# Patient Record
Sex: Male | Born: 1975 | Race: White | Hispanic: No | Marital: Married | Smoking: Never smoker
Health system: Southern US, Community
[De-identification: ages and names within clinical notes are randomized; demographics above are authoritative.]

## PROBLEM LIST (undated history)

## (undated) DIAGNOSIS — E079 Disorder of thyroid, unspecified: Secondary | ICD-10-CM

## (undated) DIAGNOSIS — N201 Calculus of ureter: Secondary | ICD-10-CM

## (undated) DIAGNOSIS — Z87442 Personal history of urinary calculi: Secondary | ICD-10-CM

## (undated) DIAGNOSIS — Z902 Acquired absence of lung [part of]: Secondary | ICD-10-CM

## (undated) HISTORY — PX: WISDOM TOOTH EXTRACTION: SHX21

## (undated) HISTORY — PX: LUNG LOBECTOMY: SHX167

---

## 2012-06-08 ENCOUNTER — Encounter (HOSPITAL_BASED_OUTPATIENT_CLINIC_OR_DEPARTMENT_OTHER): Payer: Self-pay | Admitting: *Deleted

## 2012-06-08 ENCOUNTER — Emergency Department (HOSPITAL_BASED_OUTPATIENT_CLINIC_OR_DEPARTMENT_OTHER): Payer: Managed Care, Other (non HMO)

## 2012-06-08 ENCOUNTER — Emergency Department (HOSPITAL_BASED_OUTPATIENT_CLINIC_OR_DEPARTMENT_OTHER)
Admission: EM | Admit: 2012-06-08 | Discharge: 2012-06-08 | Disposition: A | Discharge status: Home / Self Care | Payer: Managed Care, Other (non HMO) | Attending: Emergency Medicine | Admitting: Emergency Medicine

## 2012-06-08 DIAGNOSIS — R110 Nausea: Secondary | ICD-10-CM | POA: Insufficient documentation

## 2012-06-08 DIAGNOSIS — R197 Diarrhea, unspecified: Secondary | ICD-10-CM | POA: Insufficient documentation

## 2012-06-08 DIAGNOSIS — N200 Calculus of kidney: Secondary | ICD-10-CM

## 2012-06-08 LAB — URINE MICROSCOPIC-ADD ON

## 2012-06-08 LAB — URINALYSIS, ROUTINE W REFLEX MICROSCOPIC
Glucose, UA: NEGATIVE mg/dL
Leukocytes, UA: NEGATIVE
Protein, ur: NEGATIVE mg/dL
pH: 5.5 (ref 5.0–8.0)

## 2012-06-08 MED ORDER — HYDROMORPHONE HCL PF 1 MG/ML IJ SOLN
0.5000 mg | Freq: Once | INTRAMUSCULAR | Status: AC
  Administered 2012-06-08: 0.5 mg via INTRAVENOUS
  Filled 2012-06-08: qty 1

## 2012-06-08 MED ORDER — ONDANSETRON HCL 4 MG/2ML IJ SOLN
4.0000 mg | Freq: Once | INTRAMUSCULAR | Status: AC
  Administered 2012-06-08: 4 mg via INTRAVENOUS
  Filled 2012-06-08: qty 2

## 2012-06-08 MED ORDER — OXYCODONE-ACETAMINOPHEN 5-325 MG PO TABS: 1.0000 | ORAL_TABLET | ORAL | Status: DC | PRN

## 2012-06-08 MED ORDER — HYDROMORPHONE HCL PF 2 MG/ML IJ SOLN
0.5000 mg | Freq: Once | INTRAMUSCULAR | Status: AC
  Administered 2012-06-08: 0.5 mg via INTRAVENOUS
  Filled 2012-06-08: qty 1

## 2012-06-08 MED ORDER — ONDANSETRON HCL 4 MG PO TABS: 4.0000 mg | ORAL_TABLET | Freq: Three times a day (TID) | ORAL | Status: DC | PRN

## 2012-06-08 NOTE — ED Notes (Addendum)
Melvenia Beam, PA-C at bedside.

## 2012-06-08 NOTE — ED Notes (Signed)
Pt reports left flank pain since Friday- relieved Saturday- pain returned 1000 today

## 2012-06-08 NOTE — ED Provider Notes (Signed)
History     CSN: [id number]  Arrival date & time 06/08/12  1504   First MD Initiated Contact with Patient 06/08/12 1515      Chief Complaint  Patient presents with  . Flank Pain    (Consider location/radiation/quality/duration/timing/severity/associated sxs/prior treatment) Patient is a 37 y.o. male presenting with flank pain. The history is provided by the patient. No language interpreter was used.  Flank Pain This is a new problem. Associated symptoms include abdominal pain and nausea. Pertinent negatives include no coughing, fever or myalgias. Associated symptoms comments: Left flank and LLQ abdominal pain constant since this morning. He has has "twinges" of pain that were brief a couple of times this week. Nausea since this morning with pain today. No fever. He reports diarrhea as well. He has a history of kidney stones and states this feels similar..    Past Medical History  Diagnosis Date  . Kidney stones     Past Surgical History  Procedure Laterality Date  . Lobectomy    . Wisdom tooth extraction      No family history on file.  History  Substance Use Topics  . Smoking status: Never Smoker   . Smokeless tobacco: Never Used  . Alcohol Use: 1.8 oz/week    3 Cans of beer per week      Review of Systems  Constitutional: Negative for fever.  Respiratory: Negative for cough and shortness of breath.   Gastrointestinal: Positive for nausea, abdominal pain and diarrhea.  Genitourinary: Positive for flank pain. Negative for hematuria, scrotal swelling and testicular pain.  Musculoskeletal: Negative for myalgias.    Allergies  Review of patient's allergies indicates no known allergies.  Home Medications   Current Outpatient Rx  Name  Route  Sig  Dispense  Refill  . ibuprofen (ADVIL,MOTRIN) 200 MG tablet   Oral   Take 800 mg by mouth every 6 (six) hours as needed for pain.           BP 165/95  Pulse 71  Temp(Src) 98.1 F (36.7 C) (Oral)  Resp 20  Ht  5\' 11"  (1.803 m)  Wt 245 lb (111.131 kg)  BMI 34.19 kg/m2  SpO2 96%  Physical Exam  Constitutional: He is oriented to person, place, and time. He appears well-developed and well-nourished. No distress.  HENT:  Head: Normocephalic.  Pulmonary/Chest: Effort normal.  Abdominal: Soft.  LLQ tenderness without guarding or mass.   Musculoskeletal: Normal range of motion.  Neurological: He is alert and oriented to person, place, and time.  Skin: Skin is warm.  Psychiatric: He has a normal mood and affect.    ED Course  Procedures (including critical care time)  Labs Reviewed  URINALYSIS, ROUTINE W REFLEX MICROSCOPIC   Results for orders placed during the hospital encounter of 06/08/12  URINALYSIS, ROUTINE W REFLEX MICROSCOPIC      Result Value Range   Color, Urine YELLOW  YELLOW   APPearance CLEAR  CLEAR   Specific Gravity, Urine 1.029  1.005 - 1.030   pH 5.5  5.0 - 8.0   Glucose, UA NEGATIVE  NEGATIVE mg/dL   Hgb urine dipstick MODERATE (*) NEGATIVE   Bilirubin Urine NEGATIVE  NEGATIVE   Ketones, ur 15 (*) NEGATIVE mg/dL   Protein, ur NEGATIVE  NEGATIVE mg/dL   Urobilinogen, UA 0.2  0.0 - 1.0 mg/dL   Nitrite NEGATIVE  NEGATIVE   Leukocytes, UA NEGATIVE  NEGATIVE  URINE MICROSCOPIC-ADD ON      Result Value Range  Squamous Epithelial / LPF FEW (*) RARE   RBC / HPF 7-10  <3 RBC/hpf   Crystals CA OXALATE CRYSTALS (*) NEGATIVE   Ct Abdomen Pelvis Wo Contrast  06/08/2012   *RADIOLOGY REPORT*  Clinical Data:  Left-sided flank pain.  History renal stones.  CT ABDOMEN AND PELVIS WITHOUT CONTRAST (CT UROGRAM)  Technique: Contiguous axial images of the abdomen and pelvis without oral or intravenous contrast were obtained.  Comparison: None  Findings:  Exam is limited for evaluation of entities other than urinary tract calculi due to lack of oral or intravenous contrast.   Lung bases:  Normal  Abdomen/pelvis:  Borderline hepatic steatosis.  Normal spleen, stomach, pancreas, gallbladder,  biliary tract, adrenal glands, right kidney.  Moderate obstructive signs involve the left kidney and ureter to the level of a mid-left ureteric calculus which measures 6 mm on transverse image 59 and 8 mm on coronal image 51. Phleboliths in the pelvis, but no distal ureteric stone.  No retroperitoneal or retrocrural adenopathy.  Scattered colonic diverticula.  Normal terminal ileum and appendix. Normal small bowel without abdominal ascites.    No pelvic adenopathy.    Normal urinary bladder and prostate.  No significant free fluid.  Bones/Musculoskeletal:  No acute osseous abnormality.  Disc bulges at L4-L5 and L5-S1.  IMPRESSION: Moderate left-sided urinary tract obstruction secondary to an 8 mm mid left ureteric calculus.   Original Report Authenticated By: [name], M.D.   No results found.   No diagnosis found.  1. Kidney stone  MDM  Large kidney stone at mid-ureter on left. Pain is controlled. Refer to urology.        [name], PA-C 06/08/12 1744

## 2012-06-08 NOTE — ED Notes (Signed)
Pt reports unable to void at this time.

## 2012-06-10 ENCOUNTER — Other Ambulatory Visit: Payer: Self-pay | Admitting: Urology

## 2012-06-11 NOTE — ED Provider Notes (Signed)
Medical screening examination/treatment/procedure(s) were performed by non-physician practitioner and as supervising physician I was immediately available for consultation/collaboration.   Shelda Jakes, MD 06/11/12 [phone]

## 2012-06-13 ENCOUNTER — Encounter (HOSPITAL_COMMUNITY): Payer: Self-pay | Admitting: Pharmacy Technician

## 2012-06-13 ENCOUNTER — Encounter (HOSPITAL_COMMUNITY): Payer: Self-pay | Admitting: *Deleted

## 2012-06-16 ENCOUNTER — Ambulatory Visit (HOSPITAL_COMMUNITY): Payer: Managed Care, Other (non HMO)

## 2012-06-16 ENCOUNTER — Encounter (HOSPITAL_COMMUNITY)
Admission: RE | Disposition: A | Discharge status: Home / Self Care | Payer: Self-pay | Source: Ambulatory Visit | Attending: Urology

## 2012-06-16 ENCOUNTER — Ambulatory Visit (HOSPITAL_COMMUNITY)
Admission: RE | Admit: 2012-06-16 | Discharge: 2012-06-16 | Disposition: A | Discharge status: Home / Self Care | Payer: Managed Care, Other (non HMO) | Source: Ambulatory Visit | Attending: Urology | Admitting: Urology

## 2012-06-16 ENCOUNTER — Encounter (HOSPITAL_COMMUNITY): Payer: Self-pay | Admitting: *Deleted

## 2012-06-16 DIAGNOSIS — Z87442 Personal history of urinary calculi: Secondary | ICD-10-CM | POA: Insufficient documentation

## 2012-06-16 DIAGNOSIS — N201 Calculus of ureter: Secondary | ICD-10-CM

## 2012-06-16 DIAGNOSIS — Z902 Acquired absence of lung [part of]: Secondary | ICD-10-CM | POA: Insufficient documentation

## 2012-06-16 SURGERY — LITHOTRIPSY, ESWL
Anesthesia: LOCAL | Laterality: Left

## 2012-06-16 MED ORDER — CIPROFLOXACIN HCL 500 MG PO TABS
500.0000 mg | ORAL_TABLET | ORAL | Status: AC
  Administered 2012-06-16: 500 mg via ORAL
  Filled 2012-06-16: qty 1

## 2012-06-16 MED ORDER — DEXTROSE-NACL 5-0.45 % IV SOLN
INTRAVENOUS | Status: DC
  Administered 2012-06-16: 13:00:00 via INTRAVENOUS

## 2012-06-16 MED ORDER — DIAZEPAM 5 MG PO TABS
10.0000 mg | ORAL_TABLET | ORAL | Status: AC
  Administered 2012-06-16: 10 mg via ORAL
  Filled 2012-06-16: qty 2

## 2012-06-16 MED ORDER — DIPHENHYDRAMINE HCL 25 MG PO CAPS
25.0000 mg | ORAL_CAPSULE | ORAL | Status: AC
  Administered 2012-06-16: 25 mg via ORAL
  Filled 2012-06-16: qty 1

## 2012-06-16 NOTE — Interval H&P Note (Signed)
History and Physical Interval Note:  06/16/2012 1:58 PM  [employer]  has presented today for surgery, with the diagnosis of LEFT URETERAL STONE   The various methods of treatment have been discussed with the patient and family. After consideration of risks, benefits and other options for treatment, the patient has consented to  Procedure(s): LEFT EXTRACORPOREAL SHOCK WAVE LITHOTRIPSY (ESWL) (Left) as a surgical intervention .  The patient's history has been reviewed, patient examined, no change in status, stable for surgery.  Long have reviewed the patient's chart and labs.  Questions were answered to the patient's satisfaction.     Louis Long

## 2012-06-16 NOTE — Progress Notes (Signed)
Lt side redness, post lithotripsy.  D/c instructions given and explained to pt and pt wife.  Prescriptions given to pt's wife.

## 2012-06-16 NOTE — H&P (Signed)
hief Complaint  cc: [facility], [place].   Reason For Visit  Kidney stone   Active Problems Problems  1. Mid Ureteral Stone On The Left 592.1  History of Present Illness      37 yo married male presents today for f/u after being seen in the ER on 06/08/12 for Lt flank pain & LLQ abdominal pain.  CT showed an 8mm mid Lt ureteral stone. Pt passed stone  5 yrs ago. No stone analysis.   Past Medical History Problems  1. History of  Nephrolithiasis V13.01  Surgical History Problems  1. History of  Lung Lobectomy Right V45.76  Current Meds 1. Ibuprofen 800 MG Oral Tablet; Therapy: (Recorded:03Jun2014) to 2. Ondansetron 4 MG Oral Tablet Dispersible; Therapy: (Recorded:03Jun2014) to 3. Oxycodone-Acetaminophen CAPS; Therapy: (Recorded:03Jun2014) to  Allergies Medication  1. No Known Drug Allergies  Family History Problems  1. Family history of  Family Health Status - Father's Age 21. Family history of  Family Health Status - Mother's Age 79. 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  Gastrointestinal: nausea, vomiting, flank pain, abdominal pain, heartburn and diarrhea.  Constitutional: night sweats and feeling tired (fatigue).  Musculoskeletal: back pain.    Vitals Vital Signs [Data Includes: Last 1 Day]  03Jun2014 11:22AM  BMI Calculated: 34.51 BSA Calculated: 2.34 Height: 5 ft 11.7 in Weight: 252 lb  Blood Pressure: 134 / 87 Heart Rate: 84  Physical Exam Constitutional: Well  nourished and well developed . No acute distress.  ENT:. The ears and nose are normal in appearance.  Neck: The appearance of the neck is normal and no neck mass is present.  Pulmonary: No respiratory distress and normal respiratory rhythm and effort.  Cardiovascular: Heart rate and rhythm are normal . No peripheral edema.  Abdomen: The abdomen is soft and nontender. No masses are palpated. Mild tenderness in the LLQ is present. No CVA tenderness. No hernias are palpable. No hepatosplenomegaly noted.  Rectal: Rectal exam demonstrates normal sphincter tone, no tenderness and no masses. The prostate has no nodularity and is not tender. The left seminal vesicle is nonpalpable. The right seminal vesicle is nonpalpable. The perineum is normal on inspection.  Genitourinary: Examination of the penis demonstrates no discharge, no masses, no lesions and a normal meatus. The scrotum is without lesions. The right epididymis is palpably normal and non-tender. The left epididymis is palpably normal and non-tender. The right testis is non-tender and without masses. The left testis is non-tender and without masses.  Lymphatics: The femoral and inguinal nodes are not enlarged or tender.  Skin: Normal skin turgor, no visible rash and no visible skin lesions.  Neuro/Psych:. Mood and affect are appropriate.    Results/Data  Urine [Data Includes: Last 1 Day]   03Jun2014  COLOR YELLOW   APPEARANCE CLEAR   SPECIFIC GRAVITY 1.010   pH 5.5   GLUCOSE NEG mg/dL  BILIRUBIN NEG   KETONE NEG mg/dL  BLOOD NEG   PROTEIN NEG mg/dL  UROBILINOGEN 0.2 mg/dL  NITRITE NEG   LEUKOCYTE ESTERASE NEG    10 Jun 2012 11:11 AM   UA With REFLEX       COLOR YELLOW       APPEARANCE CLEAR       SPECIFIC GRAVITY 1.010       pH 5.5       GLUCOSE NEG       BILIRUBIN NEG       KETONE NEG       BLOOD NEG       PROTEIN NEG       UROBILINOGEN 0.2       NITRITE NEG       LEUKOCYTE ESTERASE NEG    Procedure  KUB: 8mm irregular  stone at same level as CT stone identified from hospital. ( CT reviewed with patient).     Assessment Assessed  1. Mid Ureteral Stone On The Left 592.1   KUB: 8mm LLureteral stone. He will need lithotripsyu, and we have discussed options of L ureteral stent tonight. or wait until next Monday for lithotripsy. he will wait for lithotripsy. He is re-written for pain med and zofran.   Plan  Mid Ureteral Stone On The Left (592.1)  1. Ondansetron 4 MG Oral Tablet Dispersible; TAKE 1 TABLET 4 times daily PRN nausea; Last  Rx:03Jun2014 2. Oxycodone-Acetaminophen 5-325 MG Oral Tablet; TAKE 1 TABLET Every  4 hours PRN pain;  Therapy: 03Jun2014 to (Evaluate:03Jul2014); Last Rx:03Jun2014 3. KUB  Done: 03Jun2014 12:00AM    1. Hold ASA. Hold ibuprofen 2. Percocet 5/325 q 4-6 prn pain/Zofran re-written. 3. lithotripsy add-on Monday night.   UA With REFLEX  Status: Resulted - Requires Verification  Done: 01Jan0001 12:00AM Ordered Today; For: Health Maintenance (V70.0); Ordered By: Jethro Bolus  Due: 05Jun2014 Marked Important; Last Updated By: Blinda Leatherwood   Signatures Electronically signed by : Jethro Bolus, M.D.; Jun 10 2012  1:18PM

## 2012-06-26 HISTORY — PX: EXTRACORPOREAL SHOCK WAVE LITHOTRIPSY: SHX1557

## 2012-08-10 ENCOUNTER — Encounter (HOSPITAL_BASED_OUTPATIENT_CLINIC_OR_DEPARTMENT_OTHER): Payer: Self-pay | Admitting: *Deleted

## 2012-08-10 ENCOUNTER — Emergency Department (HOSPITAL_BASED_OUTPATIENT_CLINIC_OR_DEPARTMENT_OTHER): Payer: Managed Care, Other (non HMO)

## 2012-08-10 ENCOUNTER — Emergency Department (HOSPITAL_BASED_OUTPATIENT_CLINIC_OR_DEPARTMENT_OTHER)
Admission: EM | Admit: 2012-08-10 | Discharge: 2012-08-10 | Disposition: A | Discharge status: Home / Self Care | Payer: Managed Care, Other (non HMO) | Attending: Emergency Medicine | Admitting: Emergency Medicine

## 2012-08-10 DIAGNOSIS — N200 Calculus of kidney: Secondary | ICD-10-CM | POA: Insufficient documentation

## 2012-08-10 DIAGNOSIS — J45909 Unspecified asthma, uncomplicated: Secondary | ICD-10-CM | POA: Insufficient documentation

## 2012-08-10 LAB — URINALYSIS, ROUTINE W REFLEX MICROSCOPIC
Glucose, UA: NEGATIVE mg/dL
Ketones, ur: 15 mg/dL — AB
Leukocytes, UA: NEGATIVE
Protein, ur: 30 mg/dL — AB

## 2012-08-10 LAB — URINE MICROSCOPIC-ADD ON

## 2012-08-10 MED ORDER — ONDANSETRON HCL 4 MG PO TABS: 4.0000 mg | ORAL_TABLET | Freq: Three times a day (TID) | ORAL | Status: DC | PRN

## 2012-08-10 MED ORDER — KETOROLAC TROMETHAMINE 30 MG/ML IJ SOLN
30.0000 mg | Freq: Once | INTRAMUSCULAR | Status: AC
  Administered 2012-08-10: 30 mg via INTRAVENOUS
  Filled 2012-08-10: qty 1

## 2012-08-10 MED ORDER — HYDROMORPHONE HCL 2 MG PO TABS: 2.0000 mg | ORAL_TABLET | ORAL | Status: DC | PRN

## 2012-08-10 MED ORDER — HYDROMORPHONE HCL PF 1 MG/ML IJ SOLN: 1.0000 mg | Freq: Once | INTRAMUSCULAR | Status: AC

## 2012-08-10 MED ORDER — HYDROMORPHONE HCL PF 1 MG/ML IJ SOLN
INTRAMUSCULAR | Status: AC
  Filled 2012-08-10: qty 1

## 2012-08-10 MED ORDER — TAMSULOSIN HCL 0.4 MG PO CAPS: 0.4000 mg | ORAL_CAPSULE | Freq: Every day | ORAL | Status: DC

## 2012-08-10 MED ORDER — HYDROMORPHONE HCL PF 1 MG/ML IJ SOLN
1.0000 mg | Freq: Once | INTRAMUSCULAR | Status: AC
  Administered 2012-08-10: 1 mg via INTRAVENOUS
  Filled 2012-08-10: qty 1

## 2012-08-10 MED ORDER — HYDROMORPHONE HCL PF 1 MG/ML IJ SOLN
INTRAMUSCULAR | Status: AC
  Administered 2012-08-10: 1 mg via INTRAVENOUS
  Filled 2012-08-10: qty 1

## 2012-08-10 MED ORDER — ONDANSETRON HCL 4 MG/2ML IJ SOLN
INTRAMUSCULAR | Status: AC
  Administered 2012-08-10: 4 mg via INTRAVENOUS
  Filled 2012-08-10: qty 2

## 2012-08-10 MED ORDER — HYDROMORPHONE HCL PF 1 MG/ML IJ SOLN
1.0000 mg | Freq: Once | INTRAMUSCULAR | Status: AC
  Administered 2012-08-10: 1 mg via INTRAVENOUS

## 2012-08-10 MED ORDER — ONDANSETRON HCL 4 MG/2ML IJ SOLN: 4.0000 mg | Freq: Once | INTRAMUSCULAR | Status: AC

## 2012-08-10 NOTE — ED Notes (Signed)
Patient states that his pain has decreased, but he still feels lower abd pressure. Patient is going to try and urinate on his own.

## 2012-08-10 NOTE — ED Notes (Signed)
Pt states onset of left flank pain and left side pain that started at 430pm. States he had a lithotripsy done in June for a kidney stone. States he passed one small stone since then. Pt unable to sit still at triage. C/o nausea. States unable to urinate at present.

## 2012-08-10 NOTE — ED Provider Notes (Signed)
CSN: [id number]     Arrival date & time 08/10/12  1918 History     First MD Initiated Contact with Patient 08/10/12 1945     Chief Complaint  Patient presents with  . Flank Pain   (Consider location/radiation/quality/duration/timing/severity/associated sxs/prior Treatment) Patient is a 37 y.o. male presenting with flank pain. The history is provided by the patient. No language interpreter was used.  Flank Pain This is a new problem. The current episode started today. The problem occurs constantly. The problem has been gradually worsening. Associated symptoms include abdominal pain. Nothing aggravates the symptoms. He has tried nothing for the symptoms. The treatment provided moderate relief.   Pt complains of pain in left back.   Pt has had kidney stones in the past.  Pt had lithrotripsy last month Past Medical History  Diagnosis Date  . Kidney stones   . Asthma     as a child  none now  . Kidney stone    Past Surgical History  Procedure Laterality Date  . Lobectomy    . Wisdom tooth extraction     No family history on file. History  Substance Use Topics  . Smoking status: Never Smoker   . Smokeless tobacco: Never Used  . Alcohol Use: No    Review of Systems  Gastrointestinal: Positive for abdominal pain.  Genitourinary: Positive for flank pain.  All other systems reviewed and are negative.    Allergies  Review of patient's allergies indicates no known allergies.  Home Medications   Current Outpatient Rx  Name  Route  Sig  Dispense  Refill  . tamsulosin (FLOMAX) 0.4 MG CAPS   Oral   Take by mouth.         . ondansetron (ZOFRAN) 4 MG tablet   Oral   Take 1 tablet (4 mg total) by mouth every 8 (eight) hours as needed for nausea.   20 tablet   0   . oxyCODONE-acetaminophen (PERCOCET/ROXICET) 5-325 MG per tablet   Oral   Take 1-2 tablets by mouth every 4 (four) hours as needed for pain.   20 tablet   0    BP 148/65  Pulse 80  Temp(Src) 97.8 F (36.6  C) (Oral)  Resp 16  Ht 5' 11.75" (1.822 m)  Wt 145 lb (65.772 kg)  BMI 19.81 kg/m2  SpO2 99% Physical Exam  Constitutional: He is oriented to person, place, and time. He appears well-developed and well-nourished.  HENT:  Head: Normocephalic.  Right Ear: External ear normal.  Mouth/Throat: Oropharynx is clear and moist.  Eyes: Pupils are equal, round, and reactive to light.  Neck: Normal range of motion. Neck supple.  Cardiovascular: Normal rate and normal heart sounds.   Pulmonary/Chest: Effort normal.  Abdominal: Soft.  Musculoskeletal: Normal range of motion.  Neurological: He is alert and oriented to person, place, and time. He has normal reflexes.  Skin: Skin is warm.  Psychiatric: He has a normal mood and affect.    ED Course   Procedures (including critical care time)  Labs Reviewed  URINALYSIS, ROUTINE W REFLEX MICROSCOPIC - Abnormal; Notable for the following:    APPearance CLOUDY (*)    Specific Gravity, Urine 1.036 (*)    Hgb urine dipstick LARGE (*)    Bilirubin Urine SMALL (*)    Ketones, ur 15 (*)    Protein, ur 30 (*)    All other components within normal limits  URINE CULTURE  URINE MICROSCOPIC-ADD ON   Ct Abdomen Pelvis Wo  Contrast  08/10/2012   *RADIOLOGY REPORT*  Clinical Data: Left flank pain.  History of urinary tract stones and prior lithotripsy.  CT ABDOMEN AND PELVIS WITHOUT CONTRAST  Technique:  Multidetector CT imaging of the abdomen and pelvis was performed following the standard protocol without intravenous contrast.  Comparison: CT abdomen and pelvis 06/08/2012.  Findings: The lung bases are clear.  No pleural or pericardial effusion is identified.  The patient has moderate left hydronephrosis.  The previously seen 0.6 cm left ureteral stone has progressed down the left ureter and is now just proximal to the UVJ.  No other urinary tract stones are seen on the right or left.  The liver, gallbladder, adrenal glands, spleen and pancreas appear normal.   The urinary bladder is decompressed but otherwise unremarkable.  A few colonic diverticula are again seen but there is no evidence of diverticulitis.  The stomach, small bowel and appendix appear normal.  No lymphadenopathy or fluid is identified. No focal bony abnormality.  IMPRESSION: Moderate left hydronephrosis persists as on the prior study.  0.6 cm proximal left ureteral stone seen on the prior examination has progressed down the left ureter and is now just proximal to the left UVJ.  No new abnormality is identified.   Original Report Authenticated By: Holley Dexter, M.D.   No diagnosis found.  MDM  Pt counseled on stone at uvj    Pt advised to call his urologist tomorrow to be seen for evaluation.   Pt given rx for flomax, zofran, dilaudid  Elson Areas, [place] 08/10/12 2229

## 2012-08-11 NOTE — ED Provider Notes (Signed)
Medical screening examination/treatment/procedure(s) were performed by non-physician practitioner and as supervising physician I was immediately available for consultation/collaboration.  Aleeha Boline, MD 08/11/12 0005 

## 2012-08-12 LAB — URINE CULTURE: Culture: NO GROWTH

## 2012-08-13 ENCOUNTER — Other Ambulatory Visit: Payer: Self-pay | Admitting: Urology

## 2012-08-14 ENCOUNTER — Encounter (HOSPITAL_BASED_OUTPATIENT_CLINIC_OR_DEPARTMENT_OTHER): Payer: Self-pay | Admitting: *Deleted

## 2012-08-18 ENCOUNTER — Encounter (HOSPITAL_BASED_OUTPATIENT_CLINIC_OR_DEPARTMENT_OTHER): Payer: Self-pay | Admitting: *Deleted

## 2012-08-18 NOTE — Progress Notes (Signed)
NPO AFTER MN. ARRIVES AT 1015. NEEDS HG. MAY TAKE RX PAIN MED (ONE TYPE) AND/ OR NAUSEA IF NEEDED W/ SIPS OF WATER.

## 2012-08-22 ENCOUNTER — Encounter (HOSPITAL_BASED_OUTPATIENT_CLINIC_OR_DEPARTMENT_OTHER)
Admission: RE | Disposition: A | Discharge status: Home / Self Care | Payer: Self-pay | Source: Ambulatory Visit | Attending: Urology

## 2012-08-22 ENCOUNTER — Ambulatory Visit (HOSPITAL_BASED_OUTPATIENT_CLINIC_OR_DEPARTMENT_OTHER): Payer: Managed Care, Other (non HMO) | Admitting: Anesthesiology

## 2012-08-22 ENCOUNTER — Ambulatory Visit (HOSPITAL_COMMUNITY): Payer: Managed Care, Other (non HMO)

## 2012-08-22 ENCOUNTER — Encounter (HOSPITAL_BASED_OUTPATIENT_CLINIC_OR_DEPARTMENT_OTHER): Payer: Self-pay | Admitting: Anesthesiology

## 2012-08-22 ENCOUNTER — Encounter (HOSPITAL_BASED_OUTPATIENT_CLINIC_OR_DEPARTMENT_OTHER): Payer: Self-pay

## 2012-08-22 ENCOUNTER — Ambulatory Visit (HOSPITAL_BASED_OUTPATIENT_CLINIC_OR_DEPARTMENT_OTHER)
Admission: RE | Admit: 2012-08-22 | Discharge: 2012-08-22 | Disposition: A | Discharge status: Home / Self Care | Payer: Managed Care, Other (non HMO) | Source: Ambulatory Visit | Attending: Urology | Admitting: Urology

## 2012-08-22 DIAGNOSIS — N132 Hydronephrosis with renal and ureteral calculous obstruction: Secondary | ICD-10-CM

## 2012-08-22 DIAGNOSIS — Z79899 Other long term (current) drug therapy: Secondary | ICD-10-CM | POA: Insufficient documentation

## 2012-08-22 DIAGNOSIS — N201 Calculus of ureter: Secondary | ICD-10-CM | POA: Insufficient documentation

## 2012-08-22 HISTORY — DX: Acquired absence of lung (part of): Z90.2

## 2012-08-22 HISTORY — PX: CYSTOSCOPY WITH RETROGRADE PYELOGRAM, URETEROSCOPY AND STENT PLACEMENT: SHX5789

## 2012-08-22 HISTORY — DX: Calculus of ureter: N20.1

## 2012-08-22 HISTORY — PX: HOLMIUM LASER APPLICATION: SHX5852

## 2012-08-22 HISTORY — DX: Personal history of urinary calculi: Z87.442

## 2012-08-22 LAB — POCT HEMOGLOBIN-HEMACUE: Hemoglobin: 16.2 g/dL (ref 13.0–17.0)

## 2012-08-22 SURGERY — CYSTOURETEROSCOPY, WITH RETROGRADE PYELOGRAM AND STENT INSERTION
Anesthesia: General | Site: Ureter | Laterality: Left

## 2012-08-22 MED ORDER — PROPOFOL 10 MG/ML IV BOLUS
INTRAVENOUS | Status: DC | PRN
  Administered 2012-08-22: 300 mg via INTRAVENOUS

## 2012-08-22 MED ORDER — OXYBUTYNIN CHLORIDE ER 5 MG PO TB24
5.0000 mg | ORAL_TABLET | Freq: Every day | ORAL | Status: DC
Start: 2012-08-22 — End: 2020-09-30

## 2012-08-22 MED ORDER — LIDOCAINE HCL (CARDIAC) 20 MG/ML IV SOLN
INTRAVENOUS | Status: DC | PRN
  Administered 2012-08-22: 60 mg via INTRAVENOUS

## 2012-08-22 MED ORDER — FENTANYL CITRATE 0.05 MG/ML IJ SOLN
25.0000 ug | INTRAMUSCULAR | Status: DC | PRN
  Filled 2012-08-22: qty 1

## 2012-08-22 MED ORDER — BELLADONNA ALKALOIDS-OPIUM 16.2-60 MG RE SUPP
RECTAL | Status: DC | PRN
  Administered 2012-08-22: 1 via RECTAL

## 2012-08-22 MED ORDER — IOHEXOL 350 MG/ML SOLN
INTRAVENOUS | Status: DC | PRN
  Administered 2012-08-22: 4 mL via INTRAVENOUS

## 2012-08-22 MED ORDER — ONDANSETRON HCL 4 MG/2ML IJ SOLN
INTRAMUSCULAR | Status: DC | PRN
  Administered 2012-08-22: 4 mg via INTRAVENOUS

## 2012-08-22 MED ORDER — LIDOCAINE HCL 2 % EX GEL
CUTANEOUS | Status: DC | PRN
  Administered 2012-08-22: 1

## 2012-08-22 MED ORDER — ACETAMINOPHEN 10 MG/ML IV SOLN
INTRAVENOUS | Status: DC | PRN
  Administered 2012-08-22: 1000 mg via INTRAVENOUS

## 2012-08-22 MED ORDER — FENTANYL CITRATE 0.05 MG/ML IJ SOLN
INTRAMUSCULAR | Status: DC | PRN
  Administered 2012-08-22 (×2): 25 ug via INTRAVENOUS
  Administered 2012-08-22: 50 ug via INTRAVENOUS

## 2012-08-22 MED ORDER — URELLE 81 MG PO TABS: 1.0000 | ORAL_TABLET | Freq: Three times a day (TID) | ORAL | Status: DC

## 2012-08-22 MED ORDER — GLYCOPYRROLATE 0.2 MG/ML IJ SOLN
INTRAMUSCULAR | Status: DC | PRN
  Administered 2012-08-22: 0.2 mg via INTRAVENOUS

## 2012-08-22 MED ORDER — CEFAZOLIN SODIUM-DEXTROSE 2-3 GM-% IV SOLR
2.0000 g | INTRAVENOUS | Status: AC
  Administered 2012-08-22: 2 g via INTRAVENOUS
  Filled 2012-08-22: qty 50

## 2012-08-22 MED ORDER — KETOROLAC TROMETHAMINE 30 MG/ML IJ SOLN
INTRAMUSCULAR | Status: DC | PRN
  Administered 2012-08-22: 30 mg via INTRAVENOUS

## 2012-08-22 MED ORDER — SODIUM CHLORIDE 0.9 % IR SOLN
Status: DC | PRN
  Administered 2012-08-22: 6000 mL

## 2012-08-22 MED ORDER — LACTATED RINGERS IV SOLN
INTRAVENOUS | Status: DC
  Administered 2012-08-22: 11:00:00 via INTRAVENOUS
  Filled 2012-08-22: qty 1000

## 2012-08-22 MED ORDER — PROMETHAZINE HCL 25 MG/ML IJ SOLN
6.2500 mg | INTRAMUSCULAR | Status: DC | PRN
  Filled 2012-08-22: qty 1

## 2012-08-22 MED ORDER — DEXAMETHASONE SODIUM PHOSPHATE 4 MG/ML IJ SOLN
INTRAMUSCULAR | Status: DC | PRN
  Administered 2012-08-22: 10 mg via INTRAVENOUS

## 2012-08-22 MED ORDER — MIDAZOLAM HCL 5 MG/5ML IJ SOLN
INTRAMUSCULAR | Status: DC | PRN
  Administered 2012-08-22: 2 mg via INTRAVENOUS

## 2012-08-22 MED ORDER — KETOROLAC TROMETHAMINE 30 MG/ML IJ SOLN
15.0000 mg | Freq: Once | INTRAMUSCULAR | Status: DC | PRN
  Filled 2012-08-22: qty 1

## 2012-08-22 MED ORDER — OXYCODONE-ACETAMINOPHEN 5-325 MG PO TABS: 1.0000 | ORAL_TABLET | ORAL | Status: DC | PRN

## 2012-08-22 MED ORDER — TRIMETHOPRIM 100 MG PO TABS: 100.0000 mg | ORAL_TABLET | ORAL | Status: DC

## 2012-08-22 SURGICAL SUPPLY — 34 items
ADAPTER CATH URET PLST 4-6FR (CATHETERS) ×3 IMPLANT
BAG DRAIN URO-CYSTO SKYTR STRL (DRAIN) ×3 IMPLANT
BASKET LASER NITINOL 1.9FR (BASKET) IMPLANT
BASKET STNLS GEMINI 4WIRE 3FR (BASKET) IMPLANT
BASKET ZERO TIP NITINOL 2.4FR (BASKET) ×3 IMPLANT
BOOTIES KNEE HIGH SLOAN (MISCELLANEOUS) ×3 IMPLANT
BRUSH URET BIOPSY 3F (UROLOGICAL SUPPLIES) IMPLANT
CANISTER SUCT LVC 12 LTR MEDI- (MISCELLANEOUS) ×3 IMPLANT
CATH CLEAR GEL 3F BACKSTOP (CATHETERS) ×3 IMPLANT
CATH INTERMIT  6FR 70CM (CATHETERS) ×3 IMPLANT
CATH URET 5FR 28IN CONE TIP (BALLOONS)
CATH URET 5FR 28IN OPEN ENDED (CATHETERS) IMPLANT
CATH URET 5FR 70CM CONE TIP (BALLOONS) IMPLANT
CATH URET DUAL LUMEN 6-10FR 50 (CATHETERS) IMPLANT
CLOTH BEACON ORANGE TIMEOUT ST (SAFETY) ×3 IMPLANT
DRAPE CAMERA CLOSED 9X96 (DRAPES) ×3 IMPLANT
ELECT REM PT RETURN 9FT ADLT (ELECTROSURGICAL)
ELECTRODE REM PT RTRN 9FT ADLT (ELECTROSURGICAL) IMPLANT
GLOVE BIO SURGEON STRL SZ7 (GLOVE) ×3 IMPLANT
GLOVE BIOGEL M 6.5 STRL (GLOVE) ×3 IMPLANT
GOWN PREVENTION PLUS LG XLONG (DISPOSABLE) ×3 IMPLANT
GOWN STRL REIN XL XLG (GOWN DISPOSABLE) ×3 IMPLANT
GUIDEWIRE 0.038 PTFE COATED (WIRE) IMPLANT
GUIDEWIRE ANG ZIPWIRE 038X150 (WIRE) IMPLANT
GUIDEWIRE STR DUAL SENSOR (WIRE) ×3 IMPLANT
IV NS IRRIG 3000ML ARTHROMATIC (IV SOLUTION) ×6 IMPLANT
KIT BALLIN UROMAX 15FX10 (LABEL) IMPLANT
KIT BALLN UROMAX 15FX4 (MISCELLANEOUS) IMPLANT
KIT BALLN UROMAX 26 75X4 (MISCELLANEOUS)
LASER FIBER DISP (UROLOGICAL SUPPLIES) ×3 IMPLANT
SET HIGH PRES BAL DIL (LABEL)
SHEATH ACCESS URETERAL 38CM (SHEATH) IMPLANT
SHEATH ACCESS URETERAL 54CM (SHEATH) IMPLANT
STENT URET 6FRX26 CONTOUR (STENTS) ×3 IMPLANT

## 2012-08-22 NOTE — Interval H&P Note (Signed)
History and Physical Interval Note:  08/22/2012 10:15 AM  Louis Long  has presented today for surgery, with the diagnosis of left ureteral stone  The various methods of treatment have been discussed with the patient and family. After consideration of risks, benefits and other options for treatment, the patient has consented to  Procedure(s): CYSTOSCOPY WITH RETROGRADE PYELOGRAM, LEFT URETEROSCOPY LASER AND LEFT DOUBLE J STENT PLACEMENT (Left) HOLMIUM LASER APPLICATION (N/A) as a surgical intervention .  The patient's history has been reviewed, patient examined, no change in status, stable for surgery.  I have reviewed the patient's chart and labs.  Questions were answered to the patient's satisfaction.     Jethro Bolus I

## 2012-08-22 NOTE — Anesthesia Preprocedure Evaluation (Signed)

## 2012-08-22 NOTE — H&P (Signed)
Problems  1. Mid Ureteral Stone On The Left 592.1  History of Present Illness         37 yo old male returns today after being seen in the ER at [facility] on 08/10/12 for Lt flank pain.  CT showed a 6mm Lt uretereal stone.  He is Lt ESWL on 06/16/12.   He was originally seen in the ER on 06/08/12 for Lt flank pain & LLQ abdominal pain.  CT showed an 8mm mid Lt ureteral stone. Pt passed stone  5 yrs ago. No stone analysis.   Past Medical History Problems  1. History of  Nephrolithiasis V13.01  Surgical History Problems  1. History of  Lithotripsy 2. History of  Lung Lobectomy Right V45.76  Current Meds 1. Diazepam 10 MG Oral Tablet; Take tablet 1 hour prior to procedure; Therapy: 30Jul2014 to (Last  Rx:30Jul2014) 2. HYDROmorphone HCl 2 MG Oral Tablet; Therapy: 03Aug2014 to 3. Ondansetron HCl 4 MG Oral Tablet; Therapy: 03Aug2014 to 4. Tamsulosin HCl 0.4 MG Oral Capsule; Take 1 capsule by mouth every day; Therapy: 30Jul2014 to  (Last Rx:30Jul2014)  Requested for: 30Jul2014  Allergies Medication  1. No Known Drug Allergies  Family History Problems  1. Family history of  Family Health Status - Father's Age 51. Family history of  Family Health Status - Mother's Age 58. Family history of  Family Health Status Number Of Children 3 sons  Social History Problems  1. Alcohol Use 1/2 per day 2. Marital History - Currently Married 3. 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 History of  Caffeine Use  Review of Systems Genitourinary, constitutional, skin, eye, otolaryngeal, hematologic/lymphatic, cardiovascular, pulmonary, endocrine, musculoskeletal, gastrointestinal, neurological and psychiatric system(s) were reviewed and pertinent findings if present are noted.  Gastrointestinal: flank pain and heartburn.    Vitals Vital Signs [Data Includes: Last 1 Day]  05Aug2014 04:03PM  Blood Pressure: 128 / 75 Temperature: 98.6 F Heart Rate:  68  Results/Data Urine [Data Includes: Last 1 Day]   05Aug2014  COLOR YELLOW   APPEARANCE CLEAR   SPECIFIC GRAVITY 1.015   pH 5.5   GLUCOSE NEG mg/dL  BILIRUBIN NEG   KETONE NEG mg/dL  BLOOD NEG   PROTEIN NEG mg/dL  UROBILINOGEN 0.2 mg/dL  NITRITE NEG   LEUKOCYTE ESTERASE NEG    Assessment Assessed  1. Distal Ureteral Stone On The Left 592.1   6mm Left ureteral vesical junction stone. It has not passed and he would like to go ahead and plan to extract it.   Plan Distal Ureteral Stone On The Left (592.1)  1. Ketorolac Tromethamine 60 MG/2ML Intramuscular Solution; INJECT 60  MG Intramuscular; To  Be Done: 05Aug2014; Status: HOLD FOR - Administration Health Maintenance (V70.0)  2. UA With REFLEX  Done: 05Aug2014 03:34PM   Toradol 60mg  today. Rapaflo/strainer. Schedule basket extraction.   Signatures Electronically signed by : Jethro Bolus, M.D.; Aug 12 2012  5:27PM

## 2012-08-22 NOTE — Transfer of Care (Signed)
Immediate Anesthesia Transfer of Care Note  Patient: Louis Long  Procedure(s) Performed: Procedure(s) (LRB): CYSTOSCOPY WITH RETROGRADE PYELOGRAM, LEFT URETEROSCOPY LASER AND LEFT DOUBLE J STENT PLACEMENT (Left) HOLMIUM LASER APPLICATION (Left)  Patient Location: PACU  Anesthesia Type: General  Level of Consciousness: awake, alert  and oriented  Airway & Oxygen Therapy: Patient Spontanous Breathing and Patient connected to face mask oxygen  Post-op Assessment: Report given to PACU RN and Post -op Vital signs reviewed and stable  Post vital signs: Reviewed and stable  Complications: No apparent anesthesia complications

## 2012-08-22 NOTE — Anesthesia Postprocedure Evaluation (Signed)
  Anesthesia Post-op Note  Patient: Louis Long  Procedure(s) Performed: Procedure(s) (LRB): CYSTOSCOPY WITH RETROGRADE PYELOGRAM, LEFT URETEROSCOPY LASER AND LEFT DOUBLE J STENT PLACEMENT (Left) HOLMIUM LASER APPLICATION (Left)  Patient Location: PACU  Anesthesia Type: General  Level of Consciousness: awake and alert   Airway and Oxygen Therapy: Patient Spontanous Breathing  Post-op Pain: mild  Post-op Assessment: Post-op Vital signs reviewed, Patient's Cardiovascular Status Stable, Respiratory Function Stable, Patent Airway and No signs of Nausea or vomiting  Last Vitals:  Filed Vitals:   08/22/12 1023  BP: 129/81  Pulse: 74  Temp: 36.6 C  Resp: 18    Post-op Vital Signs: stable   Complications: No apparent anesthesia complications

## 2012-08-22 NOTE — Op Note (Signed)
Pre-operative diagnosis :  Impacted non progressing L lower ureteral stone  Postoperative diagnosis:  Same  Operation: Cystourethroscopy, left retrograde PolyGram interpretation, left ureteroscopy, placement of Backstop, laser fragmentation of left lower ureteral calculus, basket extraction of ureteral stone fragments, insertion of left double-J stent (6 [place] by 26 cm).  Surgeon:  Kathie Rhodes. Patsi Sears, MD  First assistant:  none  Anesthesia:  General LMA  Preparation: After appropriate preanesthesia, the patient was brought to the operating room, placed on the operating table in the dorsal supine position where general LMA anesthesia was introduced. He was replaced in the dorsal lithotomy position with pubis was prepped with Betadine solution and draped in usual fashion. The arm band was double checked. The history was double checked.  Review history:  38 yo old male returns today after being seen in the ER at [facility] on 08/10/12 for Lt flank pain. CT showed a 6mm Lt uretereal stone. He is Lt ESWL on 06/16/12.  He was originally seen in the ER on 06/08/12 for Lt flank pain & LLQ abdominal pain. CT showed an 8mm mid Lt ureteral stone. Pt passed stone 5 yrs ago. No stone analysis.      Statement of  Likelihood of Success: Excellent. TIME-OUT observed.:  Procedure:  Cystourethroscopy was accomplished which showed normal appearing urethra. The bladder base appeared normal. Clear reflux was seen from both cortices. There was no evidence of bladder stone, tumor, or diverticular formation. Left retrograde PolyGram was performed, which showed a large stone in the left lower ureter. The stone measured approximately 8 mm. Stone appeared ovoid, and the multi-faceted.  A guidewire is passed around the stone into the kidney. Left ureteroscopy was accomplished, which showed that the guidewire was passed around the stone, but past submucosally at the level the stone. The guidewire was removed, and  repassed around the stone, and through the ureter, into the renal pelvis and coiled.  The ureteroscope was removed, and replaced. The Backstop was then placed proximal to the stone. This was accomplished by inserting a catheter just above the stone, and injecting the backstop material, and allowing it to the common gel. The backstop catheter was removed, and the 365 laser fiber was then passed, with settings of 0.5/20. The stone was then fragmented into sand particles, and basket extracted from the ureter into the bladder. Following complete extraction, I elected to place a 6 [place] by 26 cm double-J stent, and this was passed over the safety wire, coiled in the renal pelvis, and the bladder, under fluoroscopic control.  The sand particles were irrigated from the bladder, and will be sent for analysis. Xylocaine jelly was placed in the urethra. The patient received IV Toradol. He was awakened and taken to recovery room in good condition.

## 2012-08-22 NOTE — Anesthesia Procedure Notes (Signed)
Procedure Name: LMA Insertion Date/Time: 08/22/2012 11:31 AM Performed by: Norva Pavlov Pre-anesthesia Checklist: Patient identified, Emergency Drugs available, Suction available and Patient being monitored Patient Re-evaluated:Patient Re-evaluated prior to inductionOxygen Delivery Method: Circle System Utilized Preoxygenation: Pre-oxygenation with 100% oxygen Intubation Type: IV induction Ventilation: Mask ventilation without difficulty LMA: LMA inserted LMA Size: 5.0 Number of attempts: 1 Airway Equipment and Method: bite block Placement Confirmation: positive ETCO2 Tube secured with: Tape Dental Injury: Teeth and Oropharynx as per pre-operative assessment

## 2012-08-26 ENCOUNTER — Encounter (HOSPITAL_BASED_OUTPATIENT_CLINIC_OR_DEPARTMENT_OTHER): Payer: Self-pay | Admitting: Urology

## 2016-05-31 ENCOUNTER — Other Ambulatory Visit: Payer: Self-pay | Admitting: Family Medicine

## 2016-05-31 ENCOUNTER — Ambulatory Visit
Admission: RE | Admit: 2016-05-31 | Discharge: 2016-05-31 | Disposition: A | Discharge status: Home / Self Care | Payer: Managed Care, Other (non HMO) | Source: Ambulatory Visit | Attending: Family Medicine | Admitting: Family Medicine

## 2016-05-31 DIAGNOSIS — R079 Chest pain, unspecified: Secondary | ICD-10-CM

## 2017-08-15 ENCOUNTER — Other Ambulatory Visit: Payer: Self-pay

## 2017-08-15 ENCOUNTER — Encounter: Payer: Self-pay | Admitting: Physical Therapy

## 2017-08-15 ENCOUNTER — Ambulatory Visit: Payer: No Typology Code available for payment source | Attending: Family Medicine | Admitting: Physical Therapy

## 2017-08-15 DIAGNOSIS — M79631 Pain in right forearm: Secondary | ICD-10-CM | POA: Diagnosis present

## 2017-08-15 DIAGNOSIS — R252 Cramp and spasm: Secondary | ICD-10-CM | POA: Diagnosis present

## 2017-08-15 NOTE — Therapy (Addendum)
[facility] [address]  [place], [place], [address] Phone: [phone]   Fax:  [phone]  Physical Therapy Evaluation  Patient Details  Name: Louis Long MRN: [id number] Date of Birth: Jun 29, 1975 Referring Provider: Gaynelle Arabian, MD   Encounter Date: 08/15/2017  PT End of Session - 08/15/17 1021    Visit Number  1    Number of Visits  8    PT Start Time  1016    PT Stop Time  1059    PT Time Calculation (min)  43 min    Activity Tolerance  Patient tolerated treatment well    Behavior During Therapy  [facility] for tasks assessed/performed       Past Medical History:  Diagnosis Date  . History of kidney stones   . Left ureteral calculus   . S/P lobectomy of lung     Past Surgical History:  Procedure Laterality Date  . CYSTOSCOPY WITH RETROGRADE PYELOGRAM, URETEROSCOPY AND STENT PLACEMENT Left 08/22/2012   Procedure: CYSTOSCOPY WITH RETROGRADE PYELOGRAM, LEFT URETEROSCOPY LASER AND LEFT DOUBLE J STENT PLACEMENT;  Surgeon: Ailene Rud, MD;  Location: [facility];  Service: Urology;  Laterality: Left;  . EXTRACORPOREAL SHOCK WAVE LITHOTRIPSY Left 06-26-2012  . HOLMIUM LASER APPLICATION Left 3/41/9379   Procedure: HOLMIUM LASER APPLICATION;  Surgeon: Ailene Rud, MD;  Location: [facility];  Service: Urology;  Laterality: Left;  . LUNG LOBECTOMY Right AGE 42   OPEN MIDDLE LOBECTOMY DUE TO ABSCESS  . WISDOM TOOTH EXTRACTION      There were no vitals filed for this visit.   Subjective Assessment - 08/15/17 1026    Subjective  Pt states that that he began noticing pain in the R elbow in the early spring and late winter, especially after performing painting and house-related activities. Pt remembers one specific time when after cleaning leaves in his yard he experienced the pain for a few days after in the R forearm. He states that he notices the pain intermittently  when picking up items around his home like paint cans, a blower, or a suitcase when traveling recently, and has to hold the hand "just right" to replicate the pain when lifting.     Limitations  Lifting;House hold activities    Currently in Pain?  No/denies    Pain Score  0-No pain   7 when picking up something   Pain Location  Elbow    Pain Orientation  Right    Pain Descriptors / Indicators  Sharp    Pain Type  Acute pain    Pain Frequency  Intermittent    Aggravating Factors   Lifting objects from the ground     Pain Relieving Factors  Dropping items when carrying them; avoiding activity     Effect of Pain on Daily Activities  Pt is hesistant to pick up objects like a paint can     Multiple Pain Sites  No         [facility] PT Assessment - 08/15/17 0001      Assessment   Medical Diagnosis  R Forearm Pain    Referring Provider  Gaynelle Arabian, MD    Onset Date/Surgical Date  --   Approx March   Hand Dominance  Right    Next MD Visit  PRN    Prior Therapy  No      Prior Function   Level of Independence  Independent  Vocation  Full time employment    Vocation Requirements  A lot of sitting, typing on the computer    Leisure  Home improvement; yard work        Observation/Other Assessments   Focus on Therapeutic Outcomes (FOTO)   Intake - status 56% (limited 44%); Predicted - status 68% (limited 32%)      Functional Tests   Functional tests  Other      Other:   Other/ Comments  Attempted to replicate pain by having pt lift a 10# dumbell from the floor with wrist extension combined with pronation. Unable to replicate pain during today's session.       ROM / Strength   AROM / PROM / Strength  AROM;PROM;Strength      AROM   AROM Assessment Site  Shoulder    Right/Left Shoulder  Right;Left      Strength   Overall Strength  Within functional limits for tasks performed    Strength Assessment Site  Wrist;Hand;Elbow    Right/Left Elbow  Right;Left    Right/Left Wrist   Right;Left    Right Wrist Flexion  5/5    Right Wrist Extension  5/5    Right/Left hand  Right;Left    Right Hand Grip (lbs)  93.3    Right Hand Lateral Pinch  18 lbs    Left Hand Grip (lbs)  85    Left Hand Lateral Pinch  17.67 lbs      Palpation   Palpation comment  TTP over wrist extensor muscle group; similar to same pain that patient experiences when he has pain                 Objective measurements completed on examination: See above findings.      [facility] Adult PT Treatment/Exercise - 08/15/17 0001      Exercises   Exercises  Elbow;Wrist      Wrist Exercises   Wrist Extension  Right;Seated;Bar weights/barbell    Bar Weights/Barbell (Wrist Extension)  2 lbs    Wrist Extension Limitations  Focus on eccentric control using L UE to assist with wrist flexion of movement    Other wrist exercises  Flexion at wrist with overpressure for stretching of wrist extensors; 1 x 30 seconds              PT Education - 08/15/17 1335    Education Details  Pt educated on results of examination, POC, TP DN, and HEP exercises    Person(s) Educated  Patient    Methods  Explanation;Demonstration;Handout    Comprehension  Verbalized understanding;Returned demonstration          PT Long Term Goals - 08/15/17 1521      PT LONG TERM GOAL #1   Title  Pt will be independent with ongoing HEP    Status  New    Target Date  09/12/17      PT LONG TERM GOAL #2   Title  Pt will report pain no greater than 3/10 when occurs with activity to improve tolerance to recreational activities     Status  New    Target Date  09/12/17      PT LONG TERM GOAL #3   Title  Pt will have no increased TTP over R wrist extensor muscle group to demonstrate improved tissue extensibility     Status  New    Target Date  09/12/17      PT LONG TERM GOAL #4   Title  Pt will report no increased pain with work related activities including typing and using a computer mouse to improve tolerance to  functional activities    Status  New    Target Date  09/12/17             Plan - 08/15/17 1511    Clinical Impression Statement  Pt is a 42 y/o F who presents to OP PT with c/c of R forearm pain with symptoms consistent with lateral epicondylitis. Examination revealed TTP over wrist extensor group and reported pain with functional activities including hammering, using UEs to push up from a chair, and when occasionally when typing on his computer at work. 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 At this time he will benefit from physical therapy to address these aforementioned impairments, decrease his pain, and progress toward functional goals.     Clinical Presentation  Stable    Clinical Decision Making  Low    Rehab Potential  Good    PT Frequency  2x / week    PT Duration  4 weeks    PT Treatment/Interventions  ADLs/Self Care Home Management;Cryotherapy;Electrical Stimulation;Iontophoresis 104m/ml Dexamethasone;Moist Heat;Ultrasound;Therapeutic activities;Therapeutic exercise;Patient/family education;Neuromuscular re-education;Manual techniques;Dry needling;Taping;Vasopneumatic Device    PT Next Visit Plan  Obtain patient goals for therapy    Consulted and Agree with Plan of Care  Patient       Patient will benefit from skilled therapeutic intervention in order to improve the following deficits and impairments:  Decreased activity tolerance, Decreased strength, Impaired UE functional use, Pain, Increased muscle spasms(Decreased strength when pain occurs )  Visit Diagnosis: Pain in right forearm  Cramp and spasm     Problem List There are no active problems to display for this patient.   JShirline Frees SPT  08/15/2017, 4:45 PM  [facility] [place] [address]Phone: [phone]  Fax:  [phone] Name: Louis MclennanMRN: [id number] of Birth: 1September 10, 1977  PHYSICAL THERAPY DISCHARGE SUMMARY  Visits from Start of Care: 1  Current functional level related to goals / functional outcomes:   Refer to above eval as pt did not return for any further therapy visits. On 08/19/17, pt called to cancel all future appointments stating he was feeling better. Pt placed on hold for 30 days, but has not need to return.   Remaining deficits:   As above.   Education / Equipment:   HEP  Plan: Patient agrees to discharge.  Patient goals were not met. Patient is being discharged due to the patient's request.  ?????     JPercival Spanish PT, MPT 09/20/17, 9:09 AM  [facility] [place] [address]Phone: [phone]  Fax:  [phone]

## 2017-08-15 NOTE — Patient Instructions (Addendum)

## 2017-08-26 ENCOUNTER — Ambulatory Visit: Payer: No Typology Code available for payment source | Admitting: Physical Therapy

## 2017-08-30 ENCOUNTER — Encounter: Payer: Self-pay | Admitting: Physical Therapy

## 2017-09-03 ENCOUNTER — Encounter: Payer: Self-pay | Admitting: Physical Therapy

## 2017-09-05 ENCOUNTER — Ambulatory Visit: Payer: No Typology Code available for payment source | Admitting: Physical Therapy

## 2017-09-06 ENCOUNTER — Encounter: Payer: Self-pay | Admitting: Physical Therapy

## 2017-09-10 ENCOUNTER — Encounter: Payer: Self-pay | Admitting: Physical Therapy

## 2017-09-12 ENCOUNTER — Encounter: Payer: Self-pay | Admitting: Physical Therapy

## 2017-09-17 ENCOUNTER — Encounter: Payer: Self-pay | Admitting: Physical Therapy

## 2017-09-19 ENCOUNTER — Encounter: Payer: Self-pay | Admitting: Physical Therapy

## 2019-04-03 ENCOUNTER — Ambulatory Visit: Payer: No Typology Code available for payment source | Attending: Internal Medicine

## 2019-04-03 DIAGNOSIS — Z23 Encounter for immunization: Secondary | ICD-10-CM

## 2019-04-03 NOTE — Progress Notes (Signed)
   Covid-19 Vaccination Clinic  Name:  Louis Long    MRN: [id number] DOB: 05-13-1975  04/03/2019  [name] was observed post Covid-19 immunization for 15 minutes without incident. He was provided with Vaccine Information Sheet and instruction to access the V-Safe system.   [name] was instructed to call 911 with any severe reactions post vaccine: Marland Kitchen Difficulty breathing  . Swelling of face and throat  . A fast heartbeat  . A bad rash all over body  . Dizziness and weakness   Immunizations Administered    Name Date Dose VIS Date Route   Pfizer COVID-19 Vaccine 04/03/2019  3:46 PM 0.3 mL 12/19/2018 Intramuscular   Manufacturer: [employer], [employer]   Lot: [id number]   NDC: [address]-7

## 2019-04-29 ENCOUNTER — Ambulatory Visit: Payer: No Typology Code available for payment source | Attending: Internal Medicine

## 2019-04-29 DIAGNOSIS — Z23 Encounter for immunization: Secondary | ICD-10-CM

## 2019-04-29 NOTE — Progress Notes (Signed)
   Covid-19 Vaccination Clinic  Name:  Louis Long    MRN: [id number] DOB: October 11, 1975  04/29/2019  Louis Long was observed post Covid-19 immunization for 15 minutes without incident. He was provided with Vaccine Information Sheet and instruction to access the V-Safe system.   Louis Long was instructed to call 911 with any severe reactions post vaccine: Marland Kitchen Difficulty breathing  . Swelling of face and throat  . A fast heartbeat  . A bad rash all over body  . Dizziness and weakness   Immunizations Administered    Name Date Dose VIS Date Route   Pfizer COVID-19 Vaccine 04/29/2019  8:23 AM 0.3 mL 03/04/2018 Intramuscular   Manufacturer: [employer], [employer]   Lot: [id number]   NDC: [address]-1

## 2019-06-16 ENCOUNTER — Other Ambulatory Visit (HOSPITAL_BASED_OUTPATIENT_CLINIC_OR_DEPARTMENT_OTHER): Payer: Self-pay | Admitting: Family Medicine

## 2020-01-13 ENCOUNTER — Other Ambulatory Visit: Payer: Self-pay

## 2020-01-13 ENCOUNTER — Emergency Department (INDEPENDENT_AMBULATORY_CARE_PROVIDER_SITE_OTHER)
Admission: RE | Admit: 2020-01-13 | Discharge: 2020-01-13 | Disposition: A | Discharge status: Home / Self Care | Payer: No Typology Code available for payment source | Source: Ambulatory Visit

## 2020-01-13 ENCOUNTER — Emergency Department (INDEPENDENT_AMBULATORY_CARE_PROVIDER_SITE_OTHER): Payer: No Typology Code available for payment source

## 2020-01-13 VITALS — BP 137/88 | HR 87 | Temp 98.0°F | Resp 18 | Ht 71.5 in | Wt 255.0 lb

## 2020-01-13 DIAGNOSIS — R0781 Pleurodynia: Secondary | ICD-10-CM

## 2020-01-13 DIAGNOSIS — R079 Chest pain, unspecified: Secondary | ICD-10-CM

## 2020-01-13 DIAGNOSIS — R519 Headache, unspecified: Secondary | ICD-10-CM

## 2020-01-13 DIAGNOSIS — Z20822 Contact with and (suspected) exposure to covid-19: Secondary | ICD-10-CM

## 2020-01-13 HISTORY — DX: Disorder of thyroid, unspecified: E07.9

## 2020-01-13 MED ORDER — PREDNISONE 20 MG PO TABS: 40.0000 mg | ORAL_TABLET | Freq: Every day | ORAL | 0 refills | Status: DC

## 2020-01-13 NOTE — ED Provider Notes (Signed)
[facility]    CSN: [id number] Arrival date & time: 01/13/20  1453      History   Chief Complaint Chief Complaint  Patient presents with  . Headache  . Nasal Congestion  . Weakness    HPI Louis Long is a 45 y.o. male.   HPI  Patient presents with URI symptoms including HA, nasal congestion, fatigue, left chest wall pressure, x 1.5 day. DTR positive for COVID. Mild increased effort with breathing without overt SOB. Fully vaccinate Denies worrisome symptoms of shortness of breath, weakness, N&V, chest pain. Pt distant hx as a child of Right middle lobectomy at a child   Past Medical History:  Diagnosis Date  . History of kidney stones   . Left ureteral calculus   . S/P lobectomy of lung   . Thyroid disease     There are no problems to display for this patient.   Past Surgical History:  Procedure Laterality Date  . CYSTOSCOPY WITH RETROGRADE PYELOGRAM, URETEROSCOPY AND STENT PLACEMENT Left 08/22/2012   Procedure: CYSTOSCOPY WITH RETROGRADE PYELOGRAM, LEFT URETEROSCOPY LASER AND LEFT DOUBLE J STENT PLACEMENT;  Surgeon: Ailene Rud, MD;  Location: [facility];  Service: Urology;  Laterality: Left;  . EXTRACORPOREAL SHOCK WAVE LITHOTRIPSY Left 06-26-2012  . HOLMIUM LASER APPLICATION Left 06/04/7822   Procedure: HOLMIUM LASER APPLICATION;  Surgeon: Ailene Rud, MD;  Location: [facility];  Service: Urology;  Laterality: Left;  . LUNG LOBECTOMY Right AGE 59   OPEN MIDDLE LOBECTOMY DUE TO ABSCESS  . WISDOM TOOTH EXTRACTION         Home Medications    Prior to Admission medications   Medication Sig Start Date End Date Taking? Authorizing Provider  gabapentin (NEURONTIN) 800 MG tablet Take 750 mg by mouth.    [provider]  HYDROmorphone (DILAUDID) 2 MG tablet Take 1 tablet (2 mg total) by mouth every 4 (four) hours as needed for pain. Patient not taking: No sig reported 08/10/12   Fransico Meadow, PA-C   levothyroxine (SYNTHROID, LEVOTHROID) 50 MCG tablet Take 75 mcg by mouth daily before breakfast.    [provider]  ondansetron (ZOFRAN) 4 MG tablet Take 1 tablet (4 mg total) by mouth every 8 (eight) hours as needed for nausea. Patient not taking: No sig reported 08/10/12   Fransico Meadow, PA-C  oxybutynin (DITROPAN XL) 5 MG 24 hr tablet Take 1 tablet (5 mg total) by mouth daily. Patient not taking: Reported on 08/15/2017 08/22/12   Carolan Clines, MD  oxyCODONE-acetaminophen (PERCOCET/ROXICET) 5-325 MG per tablet Take 1-2 tablets by mouth every 4 (four) hours as needed for pain. Patient not taking: No sig reported 06/08/12   Charlann Lange, PA-C  oxyCODONE-acetaminophen (ROXICET) 5-325 MG per tablet Take 1 tablet by mouth every 4 (four) hours as needed for pain. Patient not taking: No sig reported 08/22/12   Carolan Clines, MD  tamsulosin (FLOMAX) 0.4 MG CAPS Take 1 capsule (0.4 mg total) by mouth daily. Patient not taking: No sig reported 08/10/12   Fransico Meadow, PA-C  trimethoprim (TRIMPEX) 100 MG tablet Take 1 tablet (100 mg total) by mouth 1 day or 1 dose. Patient not taking: No sig reported 08/22/12   Carolan Clines, MD  URELLE (URELLE/URISED) 81 MG TABS tablet Take 1 tablet (81 mg total) by mouth 3 (three) times daily. Patient not taking: No sig reported 08/22/12   Carolan Clines, MD    Family History Family History  Problem  Relation Age of Onset  . Healthy Mother   . Diabetes Father     Social History Social History   Tobacco Use  . Smoking status: Never Smoker  . Smokeless tobacco: Never Used  Vaping Use  . Vaping Use: Never used  Substance Use Topics  . Alcohol use: Yes    Alcohol/week: 4.0 standard drinks    Types: 4 Standard drinks or equivalent per week    Comment: weekly  . Drug use: No     Allergies   Patient has no known allergies.   Review of Systems Review of Systems Pertinent negatives listed in HPI  Physical Exam Triage  Vital Signs ED Triage Vitals  Enc Vitals Group     BP 01/13/20 1537 137/88     Pulse Rate 01/13/20 1537 87     Resp 01/13/20 1537 18     Temp 01/13/20 1537 98 F (36.7 C)     Temp Source 01/13/20 1537 Oral     SpO2 01/13/20 1537 98 %     Weight 01/13/20 1515 255 lb (115.7 kg)     Height 01/13/20 1515 5' 11.5" (1.816 m)     Head Circumference --      Peak Flow --      Pain Score 01/13/20 1515 4     Pain Loc --      Pain Edu? --      Excl. in GC? --    No data found.  Updated Vital Signs BP 137/88 (BP Location: Right Arm)   Pulse 87   Temp 98 F (36.7 C) (Oral)   Resp 18   Ht 5' 11.5" (1.816 m)   Wt 255 lb (115.7 kg)   SpO2 98%   BMI 35.07 kg/m   Visual Acuity Right Eye Distance:   Left Eye Distance:   Bilateral Distance:    Right Eye Near:   Left Eye Near:    Bilateral Near:     Physical Exam  General Appearance:    Alert, cooperative, ill-appearing  no distress  HENT:   Normocephalic, ears normal, nares mucosal edema with congestion, rhinorrhea, oropharynx  normal  Eyes:    PERRL, conjunctiva/corneas clear, EOM's intact       Lungs:     Clear to auscultation bilaterally, respirations unlabored  Heart:    Regular rate and rhythm  Neurologic:   Awake, alert, oriented x 3. No apparent focal neurological           defect.     UC Treatments / Results  Labs (all labs ordered are listed, but only abnormal results are displayed) Labs Reviewed - No data to display  EKG NSR no ST changes , 85 BPM Radiology DG Chest 2 View  Result Date: 01/13/2020 CLINICAL DATA:  Chest pain. EXAM: CHEST - 2 VIEW COMPARISON:  May 31, 2016. FINDINGS: The heart size and mediastinal contours are within normal limits. Both lungs are clear. No pneumothorax or pleural effusion is noted. The visualized skeletal structures are unremarkable. IMPRESSION: No active cardiopulmonary disease. Electronically Signed   By: Lupita Raider M.D.   On: 01/13/2020 16:28    Procedures Procedures  (including critical care time)  Medications Ordered in UC Medications - No data to display  Initial Impression / Assessment and Plan / UC Course  I have reviewed the triage vital signs and the nursing notes.  Pertinent labs & imaging results that were available during my care of the patient were reviewed by me and  considered in my medical decision making (see chart for details).     COVID/Flu test pending. High suspicious for COVID given recent exposure. Strict ER precaution if any respiratory symptoms develop. Symptom management warranted only.  Manage fever with Tylenol and ibuprofen.  Nasal symptoms with over-the-counter antihistamines recommended.  Treatment per discharge medications/discharge instructions.  Red flags/ER precautions given. The most current CDC isolation/quarantine recommendation advised.   Final Clinical Impressions(s) / UC Diagnoses   Final diagnoses:  Close exposure to COVID-19 virus  Chest pain, pleuritic  Nonintractable headache, unspecified chronicity pattern, unspecified headache type  Suspected COVID-19 virus infection     Discharge Instructions     Start prednisone 40 mg once daily to help relieve headache  and chest congestion and this will also relieve some of the pleuritic chest pain that you are experiencing which I suspect is likely related to COVID-19. If you develop any severe shortness of breath or severe dizziness or weakness go immediately to the closest emergency department.  Your vital signs are reassuring today and are all stable.  Your EKG along with your chest x-ray was also all within normal range.  Your quarantine date will and on 01/18/2019.  As long as you have been afebrile within the previous 24 hours she may return to work on that date without restrictions.    ED Prescriptions    Medication Sig Dispense Auth. Provider   predniSONE (DELTASONE) 20 MG tablet Take 2 tablets (40 mg total) by mouth daily with breakfast. 10 tablet Bing Neighbors, FNP     PDMP not reviewed this encounter.   Bing Neighbors, FNP 01/18/20 2121

## 2020-01-13 NOTE — ED Triage Notes (Addendum)
Pt presents to Urgent Care with c/o HA, nasal congestion, and fatigue x 1.5 days.  Pt's daughter is positive for COVID; he has been vaccinated. Also c/o L chest pressure/pain.

## 2020-01-13 NOTE — Discharge Instructions (Addendum)
Start prednisone 40 mg once daily to help relieve headache  and chest congestion and this will also relieve some of the pleuritic chest pain that you are experiencing which I suspect is likely related to COVID-19. If you develop any severe shortness of breath or severe dizziness or weakness go immediately to the closest emergency department.  Your vital signs are reassuring today and are all stable.  Your EKG along with your chest x-ray was also all within normal range.  Your quarantine date will and on 01/18/2019.  As long as you have been afebrile within the previous 24 hours she may return to work on that date without restrictions.

## 2020-01-13 NOTE — ED Notes (Signed)
Colin Mulders, FNP notified of pt's c/o L chest pain/pressure. New orders received.

## 2020-01-15 LAB — COVID-19, FLU A+B NAA
Influenza A, NAA: NOT DETECTED
Influenza B, NAA: NOT DETECTED
SARS-CoV-2, NAA: NOT DETECTED

## 2020-03-07 ENCOUNTER — Other Ambulatory Visit (HOSPITAL_BASED_OUTPATIENT_CLINIC_OR_DEPARTMENT_OTHER): Payer: Self-pay | Admitting: Family Medicine

## 2020-05-02 ENCOUNTER — Other Ambulatory Visit (HOSPITAL_BASED_OUTPATIENT_CLINIC_OR_DEPARTMENT_OTHER): Payer: Self-pay

## 2020-05-02 MED FILL — Levothyroxine Sodium Tab 75 MCG: ORAL | 90 days supply | Qty: 90 | Fill #0 | Status: AC

## 2020-07-16 MED FILL — Citalopram Hydrobromide Tab 20 MG (Base Equiv): ORAL | 30 days supply | Qty: 30 | Fill #0 | Status: CN

## 2020-07-18 ENCOUNTER — Other Ambulatory Visit (HOSPITAL_BASED_OUTPATIENT_CLINIC_OR_DEPARTMENT_OTHER): Payer: Self-pay

## 2020-07-26 ENCOUNTER — Other Ambulatory Visit (HOSPITAL_BASED_OUTPATIENT_CLINIC_OR_DEPARTMENT_OTHER): Payer: Self-pay

## 2020-07-29 ENCOUNTER — Other Ambulatory Visit (HOSPITAL_BASED_OUTPATIENT_CLINIC_OR_DEPARTMENT_OTHER): Payer: Self-pay

## 2020-07-29 MED FILL — Citalopram Hydrobromide Tab 20 MG (Base Equiv): ORAL | 30 days supply | Qty: 30 | Fill #0 | Status: AC

## 2020-08-08 ENCOUNTER — Other Ambulatory Visit (HOSPITAL_BASED_OUTPATIENT_CLINIC_OR_DEPARTMENT_OTHER): Payer: Self-pay

## 2020-08-08 MED ORDER — LEVOTHYROXINE SODIUM 75 MCG PO TABS
ORAL_TABLET | ORAL | 3 refills | Status: DC
  Filled 2020-08-08: qty 90, 90d supply, fill #0

## 2020-08-10 ENCOUNTER — Other Ambulatory Visit (HOSPITAL_BASED_OUTPATIENT_CLINIC_OR_DEPARTMENT_OTHER): Payer: Self-pay

## 2020-08-10 MED ORDER — LEVOTHYROXINE SODIUM 75 MCG PO TABS
ORAL_TABLET | ORAL | 3 refills | Status: DC
  Filled 2020-08-10 – 2020-11-10 (×2): qty 90, 90d supply, fill #0
  Filled 2021-02-16: qty 90, 90d supply, fill #1
  Filled 2021-05-29: qty 90, 90d supply, fill #2

## 2020-08-31 ENCOUNTER — Other Ambulatory Visit (HOSPITAL_BASED_OUTPATIENT_CLINIC_OR_DEPARTMENT_OTHER): Payer: Self-pay

## 2020-08-31 MED ORDER — AMOXICILLIN 500 MG PO CAPS
ORAL_CAPSULE | ORAL | 0 refills | Status: DC
  Filled 2020-08-31: qty 21, 7d supply, fill #0

## 2020-09-14 ENCOUNTER — Other Ambulatory Visit: Payer: Self-pay | Admitting: Physician Assistant

## 2020-09-14 DIAGNOSIS — H903 Sensorineural hearing loss, bilateral: Secondary | ICD-10-CM

## 2020-09-15 ENCOUNTER — Other Ambulatory Visit (HOSPITAL_BASED_OUTPATIENT_CLINIC_OR_DEPARTMENT_OTHER): Payer: Self-pay

## 2020-09-15 MED FILL — Citalopram Hydrobromide Tab 20 MG (Base Equiv): ORAL | 30 days supply | Qty: 30 | Fill #1 | Status: AC

## 2020-09-29 ENCOUNTER — Ambulatory Visit
Admission: RE | Admit: 2020-09-29 | Discharge: 2020-09-29 | Disposition: A | Discharge status: Home / Self Care | Payer: No Typology Code available for payment source | Source: Ambulatory Visit | Attending: Physician Assistant | Admitting: Physician Assistant

## 2020-09-29 ENCOUNTER — Other Ambulatory Visit: Payer: Self-pay

## 2020-09-29 DIAGNOSIS — H903 Sensorineural hearing loss, bilateral: Secondary | ICD-10-CM

## 2020-09-29 MED ORDER — GADOBENATE DIMEGLUMINE 529 MG/ML IV SOLN
20.0000 mL | Freq: Once | INTRAVENOUS | Status: AC | PRN
  Administered 2020-09-29: 20 mL via INTRAVENOUS

## 2020-09-30 ENCOUNTER — Other Ambulatory Visit: Payer: Self-pay

## 2020-09-30 ENCOUNTER — Other Ambulatory Visit (HOSPITAL_BASED_OUTPATIENT_CLINIC_OR_DEPARTMENT_OTHER): Payer: Self-pay

## 2020-09-30 ENCOUNTER — Ambulatory Visit
Admission: EM | Admit: 2020-09-30 | Discharge: 2020-09-30 | Disposition: A | Discharge status: Home / Self Care | Payer: No Typology Code available for payment source | Attending: Emergency Medicine | Admitting: Emergency Medicine

## 2020-09-30 DIAGNOSIS — Z23 Encounter for immunization: Secondary | ICD-10-CM

## 2020-09-30 DIAGNOSIS — S61459A Open bite of unspecified hand, initial encounter: Secondary | ICD-10-CM | POA: Diagnosis not present

## 2020-09-30 DIAGNOSIS — W540XXA Bitten by dog, initial encounter: Secondary | ICD-10-CM

## 2020-09-30 DIAGNOSIS — S61431A Puncture wound without foreign body of right hand, initial encounter: Secondary | ICD-10-CM

## 2020-09-30 MED ORDER — TETANUS-DIPHTH-ACELL PERTUSSIS 5-2.5-18.5 LF-MCG/0.5 IM SUSY
0.5000 mL | PREFILLED_SYRINGE | Freq: Once | INTRAMUSCULAR | Status: AC
Start: 2020-09-30 — End: 2020-09-30
  Administered 2020-09-30: 0.5 mL via INTRAMUSCULAR

## 2020-09-30 MED ORDER — AMOXICILLIN-POT CLAVULANATE 875-125 MG PO TABS
1.0000 | ORAL_TABLET | Freq: Two times a day (BID) | ORAL | 0 refills | Status: AC
  Filled 2020-09-30: qty 14, 7d supply, fill #0

## 2020-09-30 NOTE — ED Provider Notes (Addendum)
[facility]    CSN: [id number] Arrival date & time: 09/30/20  1152      History   Chief Complaint Chief Complaint  Patient presents with   Puncture Wound    HPI Louis Long is a 45 y.o. male.   Patient states that this morning he was outside with his dog when his neighbors dog, a pitbull ran over and got into an altercation with his dog, up a mastiff.  Patient states he attempted to separate the 2 and was able to manage control of his own dog however the owner of the pitbull did not help and the pitbull came back and attacked his dog again.  Patient states then his dog, at this time by this time very agitated, bit him on his right hand fairly hard, patient reported immediate bleeding and now feels that he has significant bruising on his hand as well.  Patient states he has full range of motion and has not noticed any increased swelling in his hand.  Patient states he washes hands well with soap and water but decided to come in to have the hand evaluated and to see if he needed another tetanus vaccine, patient states he does not recall the last time he had one.  Patient states he is also noticed that the area around the puncture wound is a little bit numb.  The history is provided by the patient.   Past Medical History:  Diagnosis Date   History of kidney stones    Left ureteral calculus    S/P lobectomy of lung    Thyroid disease     There are no problems to display for this patient.   Past Surgical History:  Procedure Laterality Date   CYSTOSCOPY WITH RETROGRADE PYELOGRAM, URETEROSCOPY AND STENT PLACEMENT Left 08/22/2012   Procedure: CYSTOSCOPY WITH RETROGRADE PYELOGRAM, LEFT URETEROSCOPY LASER AND LEFT DOUBLE J STENT PLACEMENT;  Surgeon: Kathi Ludwig, MD;  Location: [facility];  Service: Urology;  Laterality: Left;   EXTRACORPOREAL SHOCK WAVE LITHOTRIPSY Left 06-26-2012   HOLMIUM LASER APPLICATION Left 08/22/2012   Procedure: HOLMIUM LASER  APPLICATION;  Surgeon: Kathi Ludwig, MD;  Location: [facility];  Service: Urology;  Laterality: Left;   LUNG LOBECTOMY Right AGE 54   OPEN MIDDLE LOBECTOMY DUE TO ABSCESS   WISDOM TOOTH EXTRACTION         Home Medications    Prior to Admission medications   Medication Sig Start Date End Date Taking? Authorizing Provider  amoxicillin-clavulanate (AUGMENTIN) 875-125 MG tablet Take 1 tablet by mouth every 12 (twelve) hours for 7 days. 09/30/20 10/07/20 Yes Theadora Rama Scales, PA-C  citalopram (CELEXA) 20 MG tablet TAKE 1 TABLET BY MOUTH ONCE DAILY 03/07/20 03/07/21  Blair Heys, MD  levothyroxine (SYNTHROID) 75 MCG tablet TAKE 1 TABLET BY MOUTH ON AN EMPTY STOMACH IN THE MORNING ONCE A DAY 06/16/19 07/31/20  Blair Heys, MD  levothyroxine (SYNTHROID) 75 MCG tablet TAKE 1 TABLET BY MOUTH ON AN EMPTY STOMACH EVERY MORNING 08/10/20       Family History Family History  Problem Relation Age of Onset   Healthy Mother    Diabetes Father     Social History Social History   Tobacco Use   Smoking status: Never   Smokeless tobacco: Never  Vaping Use   Vaping Use: Never used  Substance Use Topics   Alcohol use: Yes    Alcohol/week: 4.0 standard drinks    Types: 4 Standard drinks or  equivalent per week    Comment: weekly   Drug use: No     Allergies   Patient has no allergy information on record.   Review of Systems Review of Systems Per HPI  Physical Exam Triage Vital Signs ED Triage Vitals [09/30/20 1223]  Enc Vitals Group     BP 130/85     Pulse Rate 75     Resp 20     Temp 98.4 F (36.9 C)     Temp Source Oral     SpO2 97 %     Weight      Height      Head Circumference      Peak Flow      Pain Score 5     Pain Loc      Pain Edu?      Excl. in GC?    No data found.  Updated Vital Signs BP 130/85 (BP Location: Left Arm)   Pulse 75   Temp 98.4 F (36.9 C) (Oral)   Resp 20   SpO2 97%   Visual Acuity Right Eye Distance:    Left Eye Distance:   Bilateral Distance:    Right Eye Near:   Left Eye Near:    Bilateral Near:     Physical Exam Vitals and nursing note reviewed.  Constitutional:      Appearance: Normal appearance.  HENT:     Head: Normocephalic and atraumatic.  Cardiovascular:     Rate and Rhythm: Normal rate and regular rhythm.     Heart sounds: Normal heart sounds.  Pulmonary:     Effort: Pulmonary effort is normal.     Breath sounds: Normal breath sounds.  Musculoskeletal:        General: Normal range of motion.     Cervical back: Normal range of motion and neck supple.  Skin:    General: Skin is warm and dry.     Comments: Puncture wound distal to right thenar eminence, small amount of arterial bleeding appreciated, wound has been well cleaned by nurse practitioner student prior to my evaluating the patient today, there is no surrounding erythema appreciated, no streaking, no purulent drainage, wound appears free of debris.  Patient has normal pressure sensation, subjective loss of light touch sensation.  Patient has full range of motion of right thumb, normal grip strength, normal wrist range of motion and strength on right side.  No swelling of proximal or distal interphalangeal joint appreciated.  No joint tenderness was appreciated  Neurological:     General: No focal deficit present.     Mental Status: He is alert and oriented to person, place, and time.     Sensory: No sensory deficit.     Motor: No weakness.  Psychiatric:        Mood and Affect: Mood normal.        Behavior: Behavior normal.     UC Treatments / Results  Labs (all labs ordered are listed, but only abnormal results are displayed) Labs Reviewed - No data to display  EKG   Radiology   Procedures Procedures (including critical care time)  Medications Ordered in UC Medications  Tdap (BOOSTRIX) injection 0.5 mL (0.5 mLs Intramuscular Given 09/30/20 1246)    Initial Impression / Assessment and Plan / UC  Course  I have reviewed the triage vital signs and the nursing notes.  Pertinent labs & imaging results that were available during my care of the patient were reviewed by me and  considered in my medical decision making (see chart for details).     Wound was well irrigated during visit today, at this time, is unremarkable for any signs of acute infection.  Patient received a Tdap vaccination during visit today as well.  Patient has been prescribed Augmentin twice daily for 7 days for prophylaxis of wound infection.  See below discharge instructions for return precautions provided to patient.  Based on physical exam findings I do not feel patient requires x-ray of his hand. Final Clinical Impressions(s) / UC Diagnoses   Final diagnoses:  Dog bite of hand, unspecified laterality, initial encounter  Puncture wound of right hand without foreign body, initial encounter     Discharge Instructions      Thank you for visiting [facility] today.  You received tetanus booster for your deep puncture wound from dog bite.  I also recommend he began a 7-day course of Augmentin, 1 tablet in the morning and evening for 7 days to cover you for more common causes of deep wound infection such as staph and strep as well as other atypical bacteria that we expect to find in the dog's mouth.  Please continue to monitor your wound for surrounding redness, purulent drainage, increasing pain, swelling of thumb or hand proximal to the wound, streaking of redness up your arm, stiffness in wrist or finger as well as fever; these are all concerns that Augmentin is not addressing the bacteria present and that you need to be seen emergently for IV antibiotics.  I do not feel that x-ray of your hand is indicated at this time due to your ability to freely move your thumb albeit with some pain which is understandable.     ED Prescriptions     Medication Sig Dispense Auth. Provider   amoxicillin-clavulanate  (AUGMENTIN) 875-125 MG tablet Take 1 tablet by mouth every 12 (twelve) hours for 7 days. 14 tablet Theadora Rama Scales, PA-C      PDMP not reviewed this encounter.   Theadora Rama Scales, PA-C 09/30/20 1259    Theadora Rama [place], PA-C 09/30/20 1259    Theadora Rama Scales, [place] 09/30/20 1300

## 2020-09-30 NOTE — Discharge Instructions (Addendum)
Thank you for visiting [facility] today.  You received tetanus booster for your deep puncture wound from dog bite.  I also recommend he began a 7-day course of Augmentin, 1 tablet in the morning and evening for 7 days to cover you for more common causes of deep wound infection such as staph and strep as well as other atypical bacteria that we expect to find in the dog's mouth.  Please continue to monitor your wound for surrounding redness, purulent drainage, increasing pain, swelling of thumb or hand proximal to the wound, streaking of redness up your arm, stiffness in wrist or finger as well as fever; these are all concerns that Augmentin is not addressing the bacteria present and that you need to be seen emergently for IV antibiotics.  I do not feel that x-ray of your hand is indicated at this time due to your ability to freely move your thumb albeit with some pain which is understandable.

## 2020-09-30 NOTE — ED Triage Notes (Signed)
Pt reports getting bit by his today to right thumb. Light bleed, covered with guaze. Patient does report having some numbness.

## 2020-10-24 ENCOUNTER — Other Ambulatory Visit (HOSPITAL_BASED_OUTPATIENT_CLINIC_OR_DEPARTMENT_OTHER): Payer: Self-pay

## 2020-10-24 MED ORDER — CITALOPRAM HYDROBROMIDE 20 MG PO TABS
ORAL_TABLET | ORAL | 3 refills | Status: DC
  Filled 2020-10-24: qty 90, 90d supply, fill #0
  Filled 2021-01-31: qty 90, 90d supply, fill #1
  Filled 2021-05-11: qty 90, 90d supply, fill #2

## 2020-10-28 ENCOUNTER — Other Ambulatory Visit (HOSPITAL_BASED_OUTPATIENT_CLINIC_OR_DEPARTMENT_OTHER): Payer: Self-pay

## 2020-10-31 ENCOUNTER — Other Ambulatory Visit (HOSPITAL_BASED_OUTPATIENT_CLINIC_OR_DEPARTMENT_OTHER): Payer: Self-pay

## 2020-11-10 ENCOUNTER — Other Ambulatory Visit (HOSPITAL_BASED_OUTPATIENT_CLINIC_OR_DEPARTMENT_OTHER): Payer: Self-pay

## 2021-01-31 ENCOUNTER — Other Ambulatory Visit (HOSPITAL_BASED_OUTPATIENT_CLINIC_OR_DEPARTMENT_OTHER): Payer: Self-pay

## 2021-02-16 ENCOUNTER — Other Ambulatory Visit (HOSPITAL_BASED_OUTPATIENT_CLINIC_OR_DEPARTMENT_OTHER): Payer: Self-pay

## 2021-05-11 ENCOUNTER — Other Ambulatory Visit (HOSPITAL_BASED_OUTPATIENT_CLINIC_OR_DEPARTMENT_OTHER): Payer: Self-pay

## 2021-05-29 ENCOUNTER — Other Ambulatory Visit (HOSPITAL_BASED_OUTPATIENT_CLINIC_OR_DEPARTMENT_OTHER): Payer: Self-pay

## 2021-07-04 ENCOUNTER — Other Ambulatory Visit (HOSPITAL_BASED_OUTPATIENT_CLINIC_OR_DEPARTMENT_OTHER): Payer: Self-pay

## 2021-08-07 ENCOUNTER — Other Ambulatory Visit (HOSPITAL_BASED_OUTPATIENT_CLINIC_OR_DEPARTMENT_OTHER): Payer: Self-pay

## 2021-08-07 MED ORDER — SERTRALINE HCL 50 MG PO TABS
ORAL_TABLET | ORAL | 6 refills | Status: DC
Start: 2021-08-07 — End: 2022-05-28
  Filled 2021-08-07: qty 30, 30d supply, fill #0
  Filled 2021-11-06: qty 30, 30d supply, fill #1
  Filled 2021-12-08: qty 30, 30d supply, fill #2
  Filled 2022-01-12: qty 30, 30d supply, fill #3
  Filled 2022-02-15: qty 30, 30d supply, fill #4
  Filled 2022-03-14: qty 30, 30d supply, fill #5
  Filled 2022-04-13 – 2022-04-24 (×2): qty 30, 30d supply, fill #6

## 2021-08-11 ENCOUNTER — Other Ambulatory Visit (HOSPITAL_BASED_OUTPATIENT_CLINIC_OR_DEPARTMENT_OTHER): Payer: Self-pay

## 2021-09-15 ENCOUNTER — Other Ambulatory Visit (HOSPITAL_BASED_OUTPATIENT_CLINIC_OR_DEPARTMENT_OTHER): Payer: Self-pay

## 2021-09-15 MED ORDER — LEVOTHYROXINE SODIUM 75 MCG PO TABS
75.0000 ug | ORAL_TABLET | Freq: Every morning | ORAL | 3 refills | Status: AC
  Filled 2021-09-15: qty 90, 90d supply, fill #0
  Filled 2021-12-29: qty 90, 90d supply, fill #1
  Filled 2022-03-29: qty 90, 90d supply, fill #2
  Filled 2022-06-27: qty 90, 90d supply, fill #3
  Filled 2022-07-09: qty 30, 30d supply, fill #3
  Filled 2022-08-01 – 2022-08-16 (×2): qty 30, 30d supply, fill #4
  Filled 2022-09-11: qty 30, 30d supply, fill #5

## 2021-09-18 ENCOUNTER — Other Ambulatory Visit (HOSPITAL_BASED_OUTPATIENT_CLINIC_OR_DEPARTMENT_OTHER): Payer: Self-pay

## 2021-11-06 ENCOUNTER — Other Ambulatory Visit (HOSPITAL_BASED_OUTPATIENT_CLINIC_OR_DEPARTMENT_OTHER): Payer: Self-pay

## 2021-12-08 ENCOUNTER — Other Ambulatory Visit (HOSPITAL_BASED_OUTPATIENT_CLINIC_OR_DEPARTMENT_OTHER): Payer: Self-pay

## 2022-01-05 ENCOUNTER — Encounter (HOSPITAL_BASED_OUTPATIENT_CLINIC_OR_DEPARTMENT_OTHER): Payer: Self-pay | Admitting: Pediatrics

## 2022-01-05 ENCOUNTER — Emergency Department (HOSPITAL_BASED_OUTPATIENT_CLINIC_OR_DEPARTMENT_OTHER): Payer: No Typology Code available for payment source

## 2022-01-05 ENCOUNTER — Emergency Department (HOSPITAL_BASED_OUTPATIENT_CLINIC_OR_DEPARTMENT_OTHER)
Admission: EM | Admit: 2022-01-05 | Discharge: 2022-01-05 | Disposition: A | Discharge status: Home / Self Care | Payer: No Typology Code available for payment source | Attending: Emergency Medicine | Admitting: Emergency Medicine

## 2022-01-05 ENCOUNTER — Other Ambulatory Visit: Payer: Self-pay

## 2022-01-05 DIAGNOSIS — S86011A Strain of right Achilles tendon, initial encounter: Secondary | ICD-10-CM | POA: Diagnosis not present

## 2022-01-05 DIAGNOSIS — X501XXA Overexertion from prolonged static or awkward postures, initial encounter: Secondary | ICD-10-CM | POA: Insufficient documentation

## 2022-01-05 DIAGNOSIS — M7989 Other specified soft tissue disorders: Secondary | ICD-10-CM | POA: Diagnosis not present

## 2022-01-05 DIAGNOSIS — S99911A Unspecified injury of right ankle, initial encounter: Secondary | ICD-10-CM | POA: Diagnosis present

## 2022-01-05 NOTE — ED Notes (Signed)
Pt provided discharge instructions and prescription information. Pt was given the opportunity to ask questions and questions were answered.   

## 2022-01-05 NOTE — Discharge Instructions (Addendum)
Please use Tylenol or ibuprofen for pain.  You may use 600 mg ibuprofen every 6 hours or 1000 mg of Tylenol every 6 hours.  You may choose to alternate between the 2.  This would be most effective.  Not to exceed 4 g of Tylenol within 24 hours.  Not to exceed 3200 mg ibuprofen 24 hours.  Apply ice to the affected extremity for 15 minutes at a time twice an hour.  Recommend weightbearing as tolerated and close follow-up with orthopedic physician whose contact information I provided as discussed.

## 2022-01-05 NOTE — ED Provider Notes (Signed)
[facility] EMERGENCY DEPARTMENT Provider Note   CSN: [id number] Arrival date & time: 01/05/22  1018     History  Chief Complaint  Patient presents with   Ankle Pain    Louis Long is a 46 y.o. male with noncontributory past medical history who presents with concern for right ankle versus calf, Achilles pain.  Patient reports that he was pushing a [employer], when he heard a pop.  Patient denies frequent physical activity.  Patient reports he has been able to walk since, but is difficult to press his ankle down, no previous history of Achilles or calf injury.  Patient reports pain 6 out of 10, worse with palpation and attempted ambulation, overall stable at rest.   Ankle Pain      Home Medications Prior to Admission medications   Medication Sig Start Date End Date Taking? Authorizing Provider  citalopram (CELEXA) 20 MG tablet TAKE 1 TABLET BY MOUTH ONCE DAILY 03/07/20 03/07/21  Blair Heys, MD  citalopram (CELEXA) 20 MG tablet Take 1 tablet by mouth once a day 10/24/20     levothyroxine (SYNTHROID) 75 MCG tablet TAKE 1 TABLET BY MOUTH ON AN EMPTY STOMACH IN THE MORNING ONCE A DAY 06/16/19 07/31/20  Blair Heys, MD  levothyroxine (SYNTHROID) 75 MCG tablet Take 1 tablet (75 mcg total) by mouth every morning on an empty stomach. 09/15/21     sertraline (ZOLOFT) 50 MG tablet 1 tablet by mouth once a day in the evening 30 day(s) 08/07/21         Allergies    Patient has no known allergies.    Review of Systems   Review of Systems  Musculoskeletal:  Positive for gait problem and joint swelling.  All other systems reviewed and are negative.   Physical Exam Updated Vital Signs BP 122/81 (BP Location: Left Arm)   Pulse 73   Temp 98.1 F (36.7 C) (Oral)   Resp 18   Ht 5\' 11"  (1.803 m)   Wt 123.4 kg   SpO2 98%   BMI 37.94 kg/m  Physical Exam Vitals and nursing note reviewed.  Constitutional:      General: He is not in acute distress.    Appearance: Normal  appearance.  HENT:     Head: Normocephalic and atraumatic.  Eyes:     General:        Right eye: No discharge.        Left eye: No discharge.  Cardiovascular:     Rate and Rhythm: Normal rate and regular rhythm.     Pulses: Normal pulses.     Comments:  intact DP, PT pulses on the right, 2+ Pulmonary:     Effort: Pulmonary effort is normal. No respiratory distress.  Musculoskeletal:        General: No deformity.     Comments: Patient without obvious deformity of right calf, Achilles tendon, he does have some tenderness Just Distal to calf attachment of the Achilles.  Negative Thompson test.  Patient is ambulatory with some difficulty.  No significant tenderness of the bony ankle, foot itself.  Decreased strength of plantarflexion, intact strength 5/5 for dorsiflexion.  Skin:    General: Skin is warm and dry.  Neurological:     Mental Status: He is alert and oriented to person, place, and time.  Psychiatric:        Mood and Affect: Mood normal.        Behavior: Behavior normal.     ED Results / Procedures /  Treatments   Labs (all labs ordered are listed, but only abnormal results are displayed) Labs Reviewed - No data to display  EKG None  Radiology DG Ankle Complete Right  Result Date: 01/05/2022 CLINICAL DATA:  Right ankle pain after injury today. EXAM: RIGHT ANKLE - COMPLETE 3+ VIEW COMPARISON:  None Available. FINDINGS: There is no evidence of fracture, dislocation, or joint effusion. There is no evidence of arthropathy or other focal bone abnormality. Soft tissues are unremarkable. IMPRESSION: Negative. Electronically Signed   By: Marijo Conception M.D.   On: 01/05/2022 10:57    Procedures Procedures    Medications Ordered in ED Medications - No data to display  ED Course/ Medical Decision Making/ A&P                           Medical Decision Making Amount and/or Complexity of Data Reviewed Radiology: ordered.   There is an overall well-appearing 46 year old  male who presents with concern for right ankle versus calf, Achilles pain while pushing a van.  Patient reports he heard a pop.  My emergent differential diagnosis includes Achilles strain,, partial versus complete rupture, injury to gastroc or soleus muscle, versus ankle sprain, other fracture, dislocation at the ankle.  Patient is neurovascularly intact, intact DP, PT pulses of the affected right lower extremity.  I do not see obvious deformity suggesting acute total Achilles rupture, but with weekend dorsiflexion and some suspicion for partial rupture versus acute strain. I independently interpreted imaging including plain film radiograph of the right ankle which shows no acute fracture, dislocation. I agree with the radiologist interpretation.  Will place patient in cam walker boot, encouraged minimal weightbearing as tolerated, ice, rest, ibuprofen, Tylenol, and close orthopedic follow-up for further evaluation and management as well as PT.  Patient understands and agrees to plan, and is discharged in stable condition at this time. Final Clinical Impression(s) / ED Diagnoses Final diagnoses:  Strain of right Achilles tendon, initial encounter    Rx / DC Orders ED Discharge Orders     None         Anselmo Pickler, PA-C 01/05/22 1148    Kemper Durie, [place] 01/05/22 1531

## 2022-01-05 NOTE — ED Triage Notes (Signed)
C/O right ankle / achilles pain; stated he heard a pop while pushing a van;

## 2022-01-11 ENCOUNTER — Ambulatory Visit (INDEPENDENT_AMBULATORY_CARE_PROVIDER_SITE_OTHER): Payer: 59 | Admitting: Family Medicine

## 2022-01-11 ENCOUNTER — Ambulatory Visit: Payer: Self-pay

## 2022-01-11 VITALS — BP 130/80 | Ht 71.0 in | Wt 272.0 lb

## 2022-01-11 DIAGNOSIS — M7660 Achilles tendinitis, unspecified leg: Secondary | ICD-10-CM

## 2022-01-11 DIAGNOSIS — S86011A Strain of right Achilles tendon, initial encounter: Secondary | ICD-10-CM

## 2022-01-11 NOTE — Patient Instructions (Signed)
Nice to meet you Please use the boot and heel lift  Please use ice as needed  We have made a referral to the surgeon   Please send me a message in [place] with any questions or updates.  Please see me back as needed.   --Dr. Raeford Razor

## 2022-01-11 NOTE — Assessment & Plan Note (Signed)
Acutely occurring.  Initial injury occurred on 12/30.  Appears to have a complete rupture on exam. -Counseled on home exercise therapy and supportive care. -Green sport insoles with heel lift. -Referral to orthopedic foot specialist.

## 2022-01-11 NOTE — Progress Notes (Signed)
  Louis Long - 47 y.o. male MRN [id number]  Date of birth: 1975/10/05  SUBJECTIVE:  Including CC & ROS.  No chief complaint on file.   Louis Long is a 47 y.o. male that is presenting with right Achilles pain.  He felt a pop while he was moving a car this past weekend.  Now he is having bruising and swelling over the right Achilles.  No history of similar pain.  No history of surgery.  Review of the emergency department visit from 12/29 shows he was placed in a walking boot. Independent review of the right ankle x-ray from 12/29 shows no acute changes.  Review of Systems See HPI   HISTORY: Past Medical, Surgical, Social, and Family History Reviewed & Updated per EMR.   Pertinent Historical Findings include:  Past Medical History:  Diagnosis Date   History of kidney stones    Left ureteral calculus    S/P lobectomy of lung    Thyroid disease     Past Surgical History:  Procedure Laterality Date   CYSTOSCOPY WITH RETROGRADE PYELOGRAM, URETEROSCOPY AND STENT PLACEMENT Left 08/22/2012   Procedure: CYSTOSCOPY WITH RETROGRADE PYELOGRAM, LEFT URETEROSCOPY LASER AND LEFT DOUBLE J STENT PLACEMENT;  Surgeon: Ailene Rud, MD;  Location: [facility];  Service: Urology;  Laterality: Left;   EXTRACORPOREAL SHOCK WAVE LITHOTRIPSY Left 06-26-2012   HOLMIUM LASER APPLICATION Left 5/37/9432   Procedure: HOLMIUM LASER APPLICATION;  Surgeon: Ailene Rud, MD;  Location: [facility];  Service: Urology;  Laterality: Left;   LUNG LOBECTOMY Right AGE 30   OPEN MIDDLE LOBECTOMY DUE TO ABSCESS   WISDOM TOOTH EXTRACTION       PHYSICAL EXAM:  VS: BP 130/80   Ht 5\' 11"  (1.803 m)   Wt 272 lb (123.4 kg)   BMI 37.94 kg/m  Physical Exam Gen: NAD, alert, cooperative with exam, well-appearing MSK:  Neurovascularly intact    Limited ultrasound: Right Achilles pain:  Normal insertion of the Achilles tendon. There is widening as skinning  proximally. There is a complete defect of the Achilles at 5.29 cm proximal from the calcaneus.  This is observed on dynamic testing.  Summary: Findings consistent with a complete mid substance Achilles rupture.  Ultrasound and interpretation by Clearance Coots, MD    ASSESSMENT & PLAN:   Achilles rupture, right, initial encounter Acutely occurring.  Initial injury occurred on 12/30.  Appears to have a complete rupture on exam. -Counseled on home exercise therapy and supportive care. -Green sport insoles with heel lift. -Referral to orthopedic foot specialist.

## 2022-01-15 DIAGNOSIS — S86011A Strain of right Achilles tendon, initial encounter: Secondary | ICD-10-CM | POA: Diagnosis not present

## 2022-01-23 ENCOUNTER — Other Ambulatory Visit (HOSPITAL_BASED_OUTPATIENT_CLINIC_OR_DEPARTMENT_OTHER): Payer: Self-pay

## 2022-01-23 DIAGNOSIS — S86091A Other specified injury of right Achilles tendon, initial encounter: Secondary | ICD-10-CM | POA: Diagnosis not present

## 2022-01-23 DIAGNOSIS — X58XXXA Exposure to other specified factors, initial encounter: Secondary | ICD-10-CM | POA: Diagnosis not present

## 2022-01-23 DIAGNOSIS — S86011A Strain of right Achilles tendon, initial encounter: Secondary | ICD-10-CM | POA: Diagnosis not present

## 2022-01-23 MED ORDER — OXYCODONE HCL 5 MG PO TABS
5.0000 mg | ORAL_TABLET | ORAL | 0 refills | Status: AC | PRN
  Filled 2022-01-23: qty 30, 5d supply, fill #0

## 2022-01-23 MED ORDER — ASPIRIN 325 MG PO TBEC
325.0000 mg | DELAYED_RELEASE_TABLET | Freq: Every day | ORAL | 0 refills | Status: AC
  Filled 2022-01-23: qty 100, 90d supply, fill #0

## 2022-02-01 DIAGNOSIS — L815 Leukoderma, not elsewhere classified: Secondary | ICD-10-CM | POA: Diagnosis not present

## 2022-02-01 DIAGNOSIS — L280 Lichen simplex chronicus: Secondary | ICD-10-CM | POA: Diagnosis not present

## 2022-02-01 DIAGNOSIS — L821 Other seborrheic keratosis: Secondary | ICD-10-CM | POA: Diagnosis not present

## 2022-02-01 DIAGNOSIS — L814 Other melanin hyperpigmentation: Secondary | ICD-10-CM | POA: Diagnosis not present

## 2022-02-01 DIAGNOSIS — D485 Neoplasm of uncertain behavior of skin: Secondary | ICD-10-CM | POA: Diagnosis not present

## 2022-02-01 DIAGNOSIS — R208 Other disturbances of skin sensation: Secondary | ICD-10-CM | POA: Diagnosis not present

## 2022-02-01 DIAGNOSIS — Z789 Other specified health status: Secondary | ICD-10-CM | POA: Diagnosis not present

## 2022-02-01 DIAGNOSIS — D1801 Hemangioma of skin and subcutaneous tissue: Secondary | ICD-10-CM | POA: Diagnosis not present

## 2022-02-01 DIAGNOSIS — L298 Other pruritus: Secondary | ICD-10-CM | POA: Diagnosis not present

## 2022-02-01 DIAGNOSIS — L820 Inflamed seborrheic keratosis: Secondary | ICD-10-CM | POA: Diagnosis not present

## 2022-02-01 DIAGNOSIS — L538 Other specified erythematous conditions: Secondary | ICD-10-CM | POA: Diagnosis not present

## 2022-02-07 DIAGNOSIS — S86091D Other specified injury of right Achilles tendon, subsequent encounter: Secondary | ICD-10-CM | POA: Diagnosis not present

## 2022-02-13 DIAGNOSIS — L281 Prurigo nodularis: Secondary | ICD-10-CM | POA: Diagnosis not present

## 2022-02-13 DIAGNOSIS — L298 Other pruritus: Secondary | ICD-10-CM | POA: Diagnosis not present

## 2022-02-13 DIAGNOSIS — Z789 Other specified health status: Secondary | ICD-10-CM | POA: Diagnosis not present

## 2022-02-13 DIAGNOSIS — L538 Other specified erythematous conditions: Secondary | ICD-10-CM | POA: Diagnosis not present

## 2022-02-13 DIAGNOSIS — R208 Other disturbances of skin sensation: Secondary | ICD-10-CM | POA: Diagnosis not present

## 2022-02-16 NOTE — Therapy (Signed)
OUTPATIENT PHYSICAL THERAPY LOWER EXTREMITY EVALUATION   Patient Name: Louis Long MRN: [id number] DOB:[birth year], 47 y.o., male Today's Date: 02/16/2022  END OF SESSION:   Past Medical History:  Diagnosis Date   History of kidney stones    Left ureteral calculus    S/P lobectomy of lung    Thyroid disease    Past Surgical History:  Procedure Laterality Date   CYSTOSCOPY WITH RETROGRADE PYELOGRAM, URETEROSCOPY AND STENT PLACEMENT Left 08/22/2012   Procedure: CYSTOSCOPY WITH RETROGRADE PYELOGRAM, LEFT URETEROSCOPY LASER AND LEFT DOUBLE J STENT PLACEMENT;  Surgeon: Ailene Rud, MD;  Location: [facility];  Service: Urology;  Laterality: Left;   EXTRACORPOREAL SHOCK WAVE LITHOTRIPSY Left 06-26-2012   HOLMIUM LASER APPLICATION Left [id number]   Procedure: HOLMIUM LASER APPLICATION;  Surgeon: Ailene Rud, MD;  Location: [facility];  Service: Urology;  Laterality: Left;   LUNG LOBECTOMY Right AGE 23   OPEN MIDDLE LOBECTOMY DUE TO ABSCESS   WISDOM TOOTH EXTRACTION     Patient Active Problem List   Diagnosis Date Noted   Achilles rupture, right, initial encounter 01/11/2022    PCP: Gaynelle Arabian, MD  REFERRING PROVIDER: Erle Crocker, MD  REFERRING DIAG: [phone] (ICD-10-CM) - S/P Achilles tendon repair   THERAPY DIAG:  No diagnosis found.  Rationale for Evaluation and Treatment: Rehabilitation  ONSET DATE: Sx on 01/11/22   NEXT MD VISIT: ***  SUBJECTIVE:   SUBJECTIVE STATEMENT: ***  PERTINENT HISTORY: Thyroid disease, Lobectomy of Lung PAIN:  Are you having pain? {OPRCPAIN:27236}  PRECAUTIONS: {Therapy precautions:24002}  WEIGHT BEARING RESTRICTIONS: {Yes ***/No:24003}  FALLS:  Has patient fallen in last 6 months? {fallsyesno:27318}  LIVING ENVIRONMENT: Lives with: {OPRC lives with:25569::"lives with their family"} Lives in: {Lives in:25570} Stairs: {opstairs:27293} Has following equipment at home:  {Assistive devices:23999}  OCCUPATION: ***  PLOF: {PLOF:24004}  LEISURE ACTIVITIES:   PATIENT GOALS: ***    OBJECTIVE:   DIAGNOSTIC FINDINGS:  01/11/22: [place] LIMITED JOINT SPACE STRUCTURES LOW RIGHT  01/05/22: DG Ankle Complete Right: There is no evidence of fracture, dislocation, or joint effusion. There is no evidence of arthropathy or other focal bone abnormality.Soft tissues are unremarkable.  PATIENT SURVEYS:  {rehab surveys:24030}  COGNITION: Overall cognitive status: {cognition:24006}     SENSATION: {sensation:27233}  EDEMA:  {edema:24020}  MUSCLE LENGTH: Hamstrings: Right *** deg; Left *** deg Marcello Moores test: Right *** deg; Left *** deg  Achilles Muscle Length:   POSTURE: {posture:25561}  PALPATION: ***  LOWER EXTREMITY ROM:  {AROM/PROM:27142} ROM Right eval Left eval  Hip flexion    Hip extension    Hip abduction    Hip adduction    Hip internal rotation    Hip external rotation    Knee flexion    Knee extension    Ankle dorsiflexion    Ankle plantarflexion    Ankle inversion    Ankle eversion     (Blank rows = not tested)  LOWER EXTREMITY MMT:  MMT Right eval Left eval  Hip flexion    Hip extension    Hip abduction    Hip adduction    Hip internal rotation    Hip external rotation    Knee flexion    Knee extension    Ankle dorsiflexion    Ankle plantarflexion    Ankle inversion    Ankle eversion     (Blank rows = not tested)  LOWER EXTREMITY SPECIAL TESTS:  {LEspecialtests:26242}  FUNCTIONAL TESTS:  {Functional tests:24029}  GAIT: Distance walked: ***  Assistive device utilized: {Assistive devices:23999} Level of assistance: {Levels of assistance:24026} Comments: ***   TODAY'S TREATMENT:                                                                                                                              DATE: ***    PATIENT EDUCATION:  Education details: *** Person educated: {Person educated:25204} Education  method: {Education Method:25205} Education comprehension: {Education Comprehension:25206}  HOME EXERCISE PROGRAM: ***  ASSESSMENT:  CLINICAL IMPRESSION: Patient is a 47 y.o. male who was seen today for physical therapy evaluation and treatment for s/p L Achilles tendon repair. .   OBJECTIVE IMPAIRMENTS: {opptimpairments:25111}.   ACTIVITY LIMITATIONS: {activitylimitations:27494}  PARTICIPATION LIMITATIONS: {participationrestrictions:25113}  PERSONAL FACTORS: {Personal factors:25162} are also affecting patient's functional outcome.   REHAB POTENTIAL: {rehabpotential:25112}  CLINICAL DECISION MAKING: {clinical decision making:25114}  EVALUATION COMPLEXITY: {Evaluation complexity:25115}   GOALS: Goals reviewed with patient? Yes  SHORT TERM GOALS: Target date: *** Pt will be independent with original HEP Baseline: Goal status: INITIAL  2.  Pt will verbalize a decrease in pain by 25-50% to increase his QOL and increase his involvement in his community Baseline:  Goal status: INITIAL  3.  *** Baseline:  Goal status: {GOALSTATUS:25110}  4.  *** Baseline:  Goal status: {GOALSTATUS:25110}  5.  *** Baseline:  Goal status: {GOALSTATUS:25110}  6.  *** Baseline:  Goal status: {GOALSTATUS:25110}  LONG TERM GOALS: Target date: ***  Patient will be independent with ongoing/advanced HEP for self-management at home Baseline:  Goal status: INITIAL  2.  Pt will report a score of ___ on the ____ to demonstrate an increase in function and decrease in pain Baseline:  Goal status: INITIAL  3.  Pt will increase ______ ROM by ____  to address deficits seen above and improve overall function.  Baseline:  Goal status: INITIAL  4.  Pt will increase _____ strength to a ___/5 to address deficits seen above and improve overall function.  Baseline:  Goal status: INITIAL  5.  Pt will verbalize a decrease in pain of 50-75% to increase his QOL and increase his involvement in his  community Baseline:  Goal status: INITIAL  6.  *** Baseline:  Goal status: {GOALSTATUS:25110}   PLAN:  PT FREQUENCY: {rehab frequency:25116}  PT DURATION: {rehab duration:25117}  PLANNED INTERVENTIONS: {rehab planned interventions:25118::"Therapeutic exercises","Therapeutic activity","Neuromuscular re-education","Balance training","Gait training","Patient/Family education","Self Care","Joint mobilization"}  PLAN FOR NEXT SESSION: ***   Velecia Ovitt Romero-Perozo, Student-PT 02/16/2022, 8:13 AM

## 2022-02-19 ENCOUNTER — Other Ambulatory Visit: Payer: Self-pay

## 2022-02-19 ENCOUNTER — Ambulatory Visit: Payer: 59 | Attending: Orthopaedic Surgery | Admitting: Physical Therapy

## 2022-02-19 ENCOUNTER — Other Ambulatory Visit (HOSPITAL_BASED_OUTPATIENT_CLINIC_OR_DEPARTMENT_OTHER): Payer: Self-pay

## 2022-02-19 ENCOUNTER — Encounter: Payer: Self-pay | Admitting: Physical Therapy

## 2022-02-19 DIAGNOSIS — R600 Localized edema: Secondary | ICD-10-CM | POA: Diagnosis not present

## 2022-02-19 DIAGNOSIS — R262 Difficulty in walking, not elsewhere classified: Secondary | ICD-10-CM | POA: Diagnosis not present

## 2022-02-19 DIAGNOSIS — M6281 Muscle weakness (generalized): Secondary | ICD-10-CM

## 2022-02-19 DIAGNOSIS — M25671 Stiffness of right ankle, not elsewhere classified: Secondary | ICD-10-CM | POA: Diagnosis not present

## 2022-02-27 ENCOUNTER — Ambulatory Visit: Payer: 59

## 2022-02-27 DIAGNOSIS — R262 Difficulty in walking, not elsewhere classified: Secondary | ICD-10-CM

## 2022-02-27 DIAGNOSIS — R600 Localized edema: Secondary | ICD-10-CM | POA: Diagnosis not present

## 2022-02-27 DIAGNOSIS — M25671 Stiffness of right ankle, not elsewhere classified: Secondary | ICD-10-CM

## 2022-02-27 DIAGNOSIS — M6281 Muscle weakness (generalized): Secondary | ICD-10-CM

## 2022-02-27 NOTE — Therapy (Signed)
OUTPATIENT PHYSICAL THERAPY TREATMENT   Patient Name: Louis Long MRN: [id number] DOB:[birth year], 47 y.o., male Today's Date: 02/27/2022  END OF SESSION:  PT End of Session - 02/27/22 1703     Visit Number 2    Date for PT Re-Evaluation 04/16/22    Authorization Type [facility] [employer] - VL: 25    PT Start Time 1619    PT Stop Time 1700    PT Time Calculation (min) 41 min    Activity Tolerance Patient tolerated treatment well    Behavior During Therapy WFL for tasks assessed/performed              Past Medical History:  Diagnosis Date   History of kidney stones    Left ureteral calculus    S/P lobectomy of lung    Thyroid disease    Past Surgical History:  Procedure Laterality Date   CYSTOSCOPY WITH RETROGRADE PYELOGRAM, URETEROSCOPY AND STENT PLACEMENT Left 08/22/2012   Procedure: CYSTOSCOPY WITH RETROGRADE PYELOGRAM, LEFT URETEROSCOPY LASER AND LEFT DOUBLE J STENT PLACEMENT;  Surgeon: Ailene Rud, MD;  Location: [facility];  Service: Urology;  Laterality: Left;   EXTRACORPOREAL SHOCK WAVE LITHOTRIPSY Left 06-26-2012   HOLMIUM LASER APPLICATION Left [id number]   Procedure: HOLMIUM LASER APPLICATION;  Surgeon: Ailene Rud, MD;  Location: [facility];  Service: Urology;  Laterality: Left;   LUNG LOBECTOMY Right AGE 74   OPEN MIDDLE LOBECTOMY DUE TO ABSCESS   WISDOM TOOTH EXTRACTION     Patient Active Problem List   Diagnosis Date Noted   Achilles rupture, right, initial encounter 01/11/2022    PCP: Gaynelle Arabian, MD  REFERRING PROVIDER: Erle Crocker, MD  REFERRING DIAG: [phone] (ICD-10-CM) - S/P Achilles tendon repair   THERAPY DIAG:  Stiffness of right ankle, not elsewhere classified  Muscle weakness (generalized)  Localized edema  Difficulty in walking, not elsewhere classified  RATIONALE FOR EVALUATION AND TREATMENT: Rehabilitation  ONSET DATE: R Sx on 01/23/22   NEXT MD VISIT: sometime in middle  March    SUBJECTIVE:   SUBJECTIVE STATEMENT: Exercises are going ok, not any pain in the achilles but sometimes gets pain on dorsal surface of foot.   PERTINENT HISTORY: Thyroid disease, Lobectomy of Lung PAIN:  Are you having pain? No  PRECAUTIONS: None  WEIGHT BEARING RESTRICTIONS: Yes 2 wedges in boot, using crutches until next follow up   FALLS:  Has patient fallen in last 6 months? Yes. 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    OBJECTIVE:   DIAGNOSTIC FINDINGS:  01/11/22: [place] LIMITED JOINT SPACE STRUCTURES LOW RIGHT  01/05/22: DG Ankle Complete Right: There is no evidence of fracture, dislocation, or joint effusion. There is no evidence of arthropathy or other focal bone abnormality.Soft tissues are unremarkable.  PATIENT SURVEYS:  LEFS 19/80=23.8%  COGNITION: Overall cognitive status: Within functional limits for tasks assessed     SENSATION: Numbness on the back of the heel before and after surgery, especially right after wearing walking book.   EDEMA:  Figure 8: 66 cm   MUSCLE LENGTH:  HS: bilat moderate tightness Quad: WNL ITB: mild tightness Hip Flexor: WNL Piriformis: WNL   POSTURE: rounded shoulders and weight shift left  PALPATION: Non tender spots  LOWER EXTREMITY ROM:  Active ROM Right eval Left eval  Hip flexion    Hip extension    Hip abduction    Hip adduction    Hip internal rotation    Hip external rotation    Knee flexion    Knee extension    Ankle dorsiflexion 11   Ankle plantarflexion 55   Ankle inversion  10   Ankle eversion 10 *    (Blank rows = not tested, *=painful )  LOWER EXTREMITY MMT:  MMT Right eval Left eval  Hip flexion 5 5  Hip extension 5 5  Hip abduction 5 5  Hip adduction 5 5  Hip internal rotation 5 5  Hip external rotation 5 5  Knee flexion 5 5  Knee extension 5 5  Ankle dorsiflexion 4+ 5  Ankle plantarflexion *   Ankle inversion 5   Ankle eversion 5    (Blank rows = not tested, *=not tested due to protocol)  FUNCTIONAL TESTS:  NT  GAIT: Distance walked: 20 m  Assistive device utilized: Crutches Level of assistance: Complete Independence Comments: step-through pattern    TODAY'S TREATMENT:                                                                                                                              DATE:   02/27/22 Therapeutic Exercise: to improve strength and mobility.  Demo, verbal and tactile cues throughout for technique.  Seated L ankle pumps 2 x 10 Seated L ankle circles 2 x 10 Seated L ankle ABC x 1 Seated L ankle EV/IV 2 x 10 3 way L SLR supine, S/L, prone x 10 Seated hamstring stretch 2x30 sec Toe curls, toe yoga Manual Therapy: to decrease muscle spasm, pain and improve mobility.  STM to L gastroc muscle belly - focused on medial side  02/19/22 Initial Evaluation HEP Introduction and Explanation:  Supine Ankle Circles CW/CC x10 Supine Ankle Pumps  x10 Seated Ankle Alphabet   Seated Heel Slide  x10    PATIENT EDUCATION:  Education details: Initial POC, Eval findings  Person educated: Patient Education method: Explanation, Demonstration, and Handouts Education comprehension: verbalized understanding and returned demonstration  HOME EXERCISE PROGRAM: Access Code: [email or web address] URL: [email or web address] Date: 02/19/2022 Prepared by: Verdene Lennert Romero-Perozo  Exercises - Supine Ankle Pumps  - 1 x daily - 7 x weekly - 2 sets - 10 reps - Supine Ankle Circles  - 1 x daily - 7 x weekly - 2 sets - 10 reps -  Ankle Alphabet in Elevation  - 1 x daily - 7 x weekly - 2 sets - Seated Heel Slide  - 1 x daily - 7 x weekly - 2 sets - 10 reps   ASSESSMENT:  CLINICAL IMPRESSION: Pt showed a good response to  initial treatment. He has complaints of popping/cracking in his ankle with exercises but no associated pain. He has removed 1 wedge this week per protocol. He also reported having tightness in his gastroc, so worked on it with STM. Added SLR to HEP 3 way and toe exercises as well. OBJECTIVE IMPAIRMENTS: Abnormal gait, decreased activity tolerance, decreased endurance, decreased knowledge of use of DME, decreased mobility, difficulty walking, decreased ROM, decreased strength, increased edema, impaired flexibility, and impaired sensation.   ACTIVITY LIMITATIONS: carrying, lifting, standing, squatting, stairs, transfers, bathing, dressing, locomotion level, and caring for others  PARTICIPATION LIMITATIONS: meal prep, cleaning, laundry, driving, shopping, community activity, occupation, and yard work  PERSONAL FACTORS: Transportation and 1 comorbidity: Thyroid disease  are also affecting patient's functional outcome.   REHAB POTENTIAL: Excellent  CLINICAL DECISION MAKING: Stable/uncomplicated  EVALUATION COMPLEXITY: Low   GOALS: Goals reviewed with patient? Yes  SHORT TERM GOALS: Target date: 03/12/2022  Pt will be independent with original HEP Baseline: Goal status: INITIAL  2.  Pt will reduce R ankle edema to increase overall ROM and weight-bearing tolerance.  Baseline: Figure 8: 66 cm  Goal status: INITIAL  LONG TERM GOALS: Target date: 04/16/2022   Patient will be independent with ongoing/advanced HEP for self-management at home Baseline:  Goal status: INITIAL  2.  Pt will report a score of >/=28/80 on the LEFS to demonstrate an increase in function and decrease in pain Baseline: 19/80 Goal status: INITIAL  3.  Pt will increase R ankle ROM to WNL to normalize his gait and increase  ability to ascend/descend stairs.  Baseline: see chart above Goal status: INITIAL  4.  Pt will be able to fully weight-bear on both legs to increase his independence with daily activities  Baseline:  Goal status: INITIAL  5.  Pt will be able to do his yard work with minimal limitations in his R ankle.  Baseline:  Goal status: INITIAL   PLAN:w  PT FREQUENCY: 2x/week  PT DURATION: 8 weeks  PLANNED INTERVENTIONS: Therapeutic exercises, Therapeutic activity, Neuromuscular re-education, Balance training, Gait training, Patient/Family education, Self Care, Joint mobilization, Stair training, DME instructions, Dry Needling, Electrical stimulation, Cryotherapy, Moist heat, scar mobilization, Taping, Vasopneumatic device, Ultrasound, Manual therapy, and Re-evaluation  PLAN FOR NEXT SESSION: Check in on HEP and update as appropriate, ask about heel wedge progression, rehab per protocol - begin gentle ROM exercises and joint mobs as needed, gentle scar mobilization   Artist Pais, PTA 02/27/2022, 5:10 PM

## 2022-03-01 ENCOUNTER — Ambulatory Visit: Payer: 59

## 2022-03-01 DIAGNOSIS — M25671 Stiffness of right ankle, not elsewhere classified: Secondary | ICD-10-CM

## 2022-03-01 DIAGNOSIS — R262 Difficulty in walking, not elsewhere classified: Secondary | ICD-10-CM | POA: Diagnosis not present

## 2022-03-01 DIAGNOSIS — M6281 Muscle weakness (generalized): Secondary | ICD-10-CM | POA: Diagnosis not present

## 2022-03-01 DIAGNOSIS — R600 Localized edema: Secondary | ICD-10-CM | POA: Diagnosis not present

## 2022-03-01 NOTE — Therapy (Addendum)
OUTPATIENT PHYSICAL THERAPY TREATMENT   Patient Name: Louis Long MRN: [id number] DOB:March 02, 1975, 47 y.o., male Today's Date: 03/01/2022  END OF SESSION:  PT End of Session - 03/01/22 1015     Visit Number 3    Date for PT Re-Evaluation 04/16/22    Authorization Type [facility] [employer] - VL: 25    PT Start Time 0933    PT Stop Time 1015    PT Time Calculation (min) 42 min    Activity Tolerance Patient tolerated treatment well    Behavior During Therapy WFL for tasks assessed/performed               Past Medical History:  Diagnosis Date   History of kidney stones    Left ureteral calculus    S/P lobectomy of lung    Thyroid disease    Past Surgical History:  Procedure Laterality Date   CYSTOSCOPY WITH RETROGRADE PYELOGRAM, URETEROSCOPY AND STENT PLACEMENT Left 08/22/2012   Procedure: CYSTOSCOPY WITH RETROGRADE PYELOGRAM, LEFT URETEROSCOPY LASER AND LEFT DOUBLE J STENT PLACEMENT;  Surgeon: Ailene Rud, MD;  Location: [place];  Service: Urology;  Laterality: Left;   EXTRACORPOREAL SHOCK WAVE LITHOTRIPSY Left 06-26-2012   HOLMIUM LASER APPLICATION Left [id number]   Procedure: HOLMIUM LASER APPLICATION;  Surgeon: Ailene Rud, MD;  Location: [facility];  Service: Urology;  Laterality: Left;   LUNG LOBECTOMY Right AGE 63   OPEN MIDDLE LOBECTOMY DUE TO ABSCESS   WISDOM TOOTH EXTRACTION     Patient Active Problem List   Diagnosis Date Noted   Achilles rupture, right, initial encounter 01/11/2022    PCP: Gaynelle Arabian, MD  REFERRING PROVIDER: Erle Crocker, MD  REFERRING DIAG: [phone] (ICD-10-CM) - S/P Achilles tendon repair   THERAPY DIAG:  Stiffness of right ankle, not elsewhere classified  Muscle weakness (generalized)  Localized edema  Difficulty in walking, not elsewhere classified  RATIONALE FOR EVALUATION AND TREATMENT: Rehabilitation  ONSET DATE: R Sx on 01/23/22   NEXT MD VISIT: sometime in middle  March    SUBJECTIVE:   SUBJECTIVE STATEMENT: Pt reports no pain, still feeling tight in his calves.  PERTINENT HISTORY: Thyroid disease, Lobectomy of Lung PAIN:  Are you having pain? No  PRECAUTIONS: None  WEIGHT BEARING RESTRICTIONS: Yes 2 wedges in boot, using crutches until next follow up   FALLS:  Has patient fallen in last 6 months? Yes. 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    OBJECTIVE:   DIAGNOSTIC FINDINGS:  01/11/22: [place] LIMITED JOINT SPACE STRUCTURES LOW RIGHT  01/05/22: DG Ankle Complete Right: There is no evidence of fracture, dislocation, or joint effusion. There is no evidence of arthropathy or other focal bone abnormality.Soft tissues are unremarkable.  PATIENT SURVEYS:  LEFS 19/80=23.8%  COGNITION: Overall cognitive status: Within functional limits for tasks assessed     SENSATION: Numbness on the back of the heel before and after surgery, especially right after wearing walking book.   EDEMA:  Figure 8: 66 cm   MUSCLE LENGTH: HS: bilat moderate tightness Quad: WNL ITB: mild tightness  Hip Flexor: WNL Piriformis: WNL   POSTURE: rounded shoulders and weight shift left  PALPATION: Non tender spots  LOWER EXTREMITY ROM:  Active ROM Right eval Left eval  Hip flexion    Hip extension    Hip abduction    Hip adduction    Hip internal rotation    Hip external rotation    Knee flexion    Knee extension    Ankle dorsiflexion 11   Ankle plantarflexion 55   Ankle inversion 10   Ankle eversion 10 *    (Blank rows = not  tested, *=painful )  LOWER EXTREMITY MMT:  MMT Right eval Left eval  Hip flexion 5 5  Hip extension 5 5  Hip abduction 5 5  Hip adduction 5 5  Hip internal rotation 5 5  Hip external rotation 5 5  Knee flexion 5 5  Knee extension 5 5  Ankle dorsiflexion 4+ 5  Ankle plantarflexion *   Ankle inversion 5   Ankle eversion 5    (Blank rows = not tested, *=not tested due to protocol)  FUNCTIONAL TESTS:  NT  GAIT: Distance walked: 20 m  Assistive device utilized: Crutches Level of assistance: Complete Independence Comments: step-through pattern    TODAY'S TREATMENT:                                                                                                                              DATE:   03/01/22 Therapeutic Exercise: to improve strength and mobility.  Demo, verbal and tactile cues throughout for technique.  Seated: -R ankle circles x 20 -R ankle pumps x 20 -R EV/IV x 20 -R ankle ABC x 1 -R toe spread, toe yoga x 10 Supine SLR 2# RLE 2x10 S/L hip abduction 2# RLE 2x10 Prone hip extension 2# RLE 2x10  S/L clamshells GTB L x 10 with 5 sec hold Manual Therapy: to decrease muscle spasm, pain and improve mobility.  STM to L gastroc muscle belly - focused on medial side  02/27/22 Therapeutic Exercise: to improve strength and mobility.  Demo, verbal and tactile cues throughout for technique.  Seated L ankle pumps 2 x 10 Seated L ankle circles 2 x 10 Seated L ankle ABC x 1 Seated L ankle EV/IV 2 x 10 3 way L SLR supine, S/L, prone x 10 Seated hamstring stretch 2x30 sec Toe curls, toe yoga Manual Therapy: to decrease muscle spasm, pain and improve mobility.  STM to L gastroc muscle belly - focused on medial side  02/19/22 Initial Evaluation HEP Introduction and Explanation:  Supine Ankle Circles CW/CC x10 Supine Ankle Pumps  x10 Seated Ankle Alphabet   Seated Heel Slide  x10    PATIENT EDUCATION:  Education details: Initial POC, Eval findings  Person  educated: Patient Education method: Explanation, Demonstration, and Handouts Education comprehension: verbalized understanding and returned demonstration  HOME EXERCISE PROGRAM: Access Code: [email or web address] URL: [email or web address] Date: 02/19/2022 Prepared by: Verdene Lennert Romero-Perozo  Exercises - Supine Ankle Pumps  - 1 x daily - 7 x weekly - 2 sets - 10 reps - Supine Ankle Circles  - 1 x daily - 7 x weekly - 2 sets - 10 reps - Ankle Alphabet in Elevation  - 1 x daily - 7 x weekly - 2 sets - Seated Heel Slide  - 1 x daily - 7 x weekly - 2 sets - 10 reps   ASSESSMENT:  CLINICAL IMPRESSION: Pt arrived with no new complaints. We were able to continue with exercises working on ankle flexibility and proximal LE strengthening per protocol. Pt had reports of radiating pain down foot when he touches the dorsal surface of his L 2nd and 3rd toes but only mild. He continues to have tightness in his L calf on the lateral side. He will be out of town next week but will be back Thurs and Fri, will see if he can be wait listed for next week.  OBJECTIVE IMPAIRMENTS: Abnormal gait, decreased activity tolerance, decreased endurance, decreased knowledge of use of DME, decreased mobility, difficulty walking, decreased ROM, decreased strength, increased edema, impaired flexibility, and impaired sensation.   ACTIVITY LIMITATIONS: carrying, lifting, standing, squatting, stairs, transfers, bathing, dressing, locomotion level, and caring for others  PARTICIPATION LIMITATIONS: meal prep, cleaning, laundry, driving, shopping, community activity, occupation, and yard work  PERSONAL FACTORS: Transportation and 1 comorbidity: Thyroid disease  are also affecting patient's functional outcome.   REHAB POTENTIAL: Excellent  CLINICAL DECISION MAKING: Stable/uncomplicated  EVALUATION COMPLEXITY: Low   GOALS: Goals reviewed with patient? Yes  SHORT TERM GOALS: Target date: 03/12/2022  Pt will be  independent with original HEP Baseline: Goal status: INITIAL  2.  Pt will reduce R ankle edema to increase overall ROM and weight-bearing tolerance.  Baseline: Figure 8: 66 cm  Goal status: INITIAL  LONG TERM GOALS: Target date: 04/16/2022   Patient will be independent with ongoing/advanced HEP for self-management at home Baseline:  Goal status: INITIAL  2.  Pt will report a score of >/=28/80 on the LEFS to demonstrate an increase in function and decrease in pain Baseline: 19/80 Goal status: INITIAL  3.  Pt will increase R ankle ROM to WNL to normalize his gait and increase ability to ascend/descend stairs.  Baseline: see chart above Goal status: INITIAL  4.  Pt will be able to fully weight-bear on both legs to increase his independence with daily activities  Baseline:  Goal status: INITIAL  5.  Pt will be able to do his yard work with minimal limitations in his R ankle.  Baseline:  Goal status: INITIAL   PLAN:w  PT FREQUENCY: 2x/week  PT DURATION: 8 weeks  PLANNED INTERVENTIONS: Therapeutic exercises, Therapeutic activity, Neuromuscular re-education, Balance training, Gait training, Patient/Family education, Self Care, Joint mobilization, Stair training, DME instructions, Dry Needling, Electrical stimulation, Cryotherapy, Moist heat, scar mobilization, Taping, Vasopneumatic device, Ultrasound, Manual therapy, and Re-evaluation  PLAN FOR NEXT SESSION: Check in on HEP and update as appropriate, ask about heel wedge progression, rehab per protocol - begin gentle ROM exercises and joint mobs as needed, gentle scar mobilization   Artist Pais, PTA 03/01/2022, 10:42 AM

## 2022-03-05 ENCOUNTER — Encounter: Payer: 59 | Admitting: Physical Therapy

## 2022-03-09 ENCOUNTER — Ambulatory Visit: Payer: 59 | Attending: Orthopaedic Surgery

## 2022-03-09 DIAGNOSIS — M25671 Stiffness of right ankle, not elsewhere classified: Secondary | ICD-10-CM | POA: Diagnosis not present

## 2022-03-09 DIAGNOSIS — R600 Localized edema: Secondary | ICD-10-CM | POA: Diagnosis not present

## 2022-03-09 DIAGNOSIS — M6281 Muscle weakness (generalized): Secondary | ICD-10-CM | POA: Insufficient documentation

## 2022-03-09 DIAGNOSIS — R262 Difficulty in walking, not elsewhere classified: Secondary | ICD-10-CM | POA: Diagnosis not present

## 2022-03-09 NOTE — Therapy (Signed)
OUTPATIENT PHYSICAL THERAPY TREATMENT   Patient Name: Louis Long MRN: [id number] DOB:01/07/1976, 47 y.o., male Today's Date: 03/09/2022  END OF SESSION:  PT End of Session - 03/09/22 1102     Visit Number 4    Date for PT Re-Evaluation 04/16/22    Authorization Type [facility] [employer] - VL: 25    PT Start Time 1017    PT Stop Time 1059    PT Time Calculation (min) 42 min    Activity Tolerance Patient tolerated treatment well    Behavior During Therapy WFL for tasks assessed/performed                Past Medical History:  Diagnosis Date   History of kidney stones    Left ureteral calculus    S/P lobectomy of lung    Thyroid disease    Past Surgical History:  Procedure Laterality Date   CYSTOSCOPY WITH RETROGRADE PYELOGRAM, URETEROSCOPY AND STENT PLACEMENT Left 08/22/2012   Procedure: CYSTOSCOPY WITH RETROGRADE PYELOGRAM, LEFT URETEROSCOPY LASER AND LEFT DOUBLE J STENT PLACEMENT;  Surgeon: Ailene Rud, MD;  Location: [facility];  Service: Urology;  Laterality: Left;   EXTRACORPOREAL SHOCK WAVE LITHOTRIPSY Left 06-26-2012   HOLMIUM LASER APPLICATION Left [id number]   Procedure: HOLMIUM LASER APPLICATION;  Surgeon: Ailene Rud, MD;  Location: [facility];  Service: Urology;  Laterality: Left;   LUNG LOBECTOMY Right AGE 56   OPEN MIDDLE LOBECTOMY DUE TO ABSCESS   WISDOM TOOTH EXTRACTION     Patient Active Problem List   Diagnosis Date Noted   Achilles rupture, right, initial encounter 01/11/2022    PCP: Gaynelle Arabian, MD  REFERRING PROVIDER: Erle Crocker, MD  REFERRING DIAG: [phone] (ICD-10-CM) - S/P Achilles tendon repair   THERAPY DIAG:  Stiffness of right ankle, not elsewhere classified  Muscle weakness (generalized)  Localized edema  Difficulty in walking, not elsewhere classified  RATIONALE FOR EVALUATION AND TREATMENT: Rehabilitation  ONSET DATE: R Sx on 01/23/22   NEXT MD VISIT:  03/21/22   SUBJECTIVE:   SUBJECTIVE STATEMENT: Pt reports no increased pain, went to conference this week did a lot of walking but did fine.  PERTINENT HISTORY: Thyroid disease, Lobectomy of Lung PAIN:  Are you having pain? No  PRECAUTIONS: None  WEIGHT BEARING RESTRICTIONS: Yes 2 wedges in boot, using crutches until next follow up   FALLS:  Has patient fallen in last 6 months? Yes. 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    OBJECTIVE:   DIAGNOSTIC FINDINGS:  01/11/22: [place] LIMITED JOINT SPACE STRUCTURES LOW RIGHT  01/05/22: DG Ankle Complete Right: There is no evidence of fracture, dislocation, or joint effusion. There is no evidence of arthropathy or other focal bone abnormality.Soft tissues are unremarkable.  PATIENT SURVEYS:  LEFS 19/80=23.8%  COGNITION: Overall cognitive status: Within functional limits for tasks assessed     SENSATION: Numbness on the back of the heel before and after surgery, especially right after wearing walking book.   EDEMA:  Figure 8: 66 cm   MUSCLE LENGTH: HS: bilat moderate tightness  Quad: WNL ITB: mild tightness Hip Flexor: WNL Piriformis: WNL   POSTURE: rounded shoulders and weight shift left  PALPATION: Non tender spots  LOWER EXTREMITY ROM:  Active ROM Right eval Left eval  Hip flexion    Hip extension    Hip abduction    Hip adduction    Hip internal rotation    Hip external rotation    Knee flexion    Knee extension    Ankle dorsiflexion 11   Ankle plantarflexion 55   Ankle inversion 10   Ankle  eversion 10 *    (Blank rows = not tested, *=painful )  LOWER EXTREMITY MMT:  MMT Right eval Left eval  Hip flexion 5 5  Hip extension 5 5  Hip abduction 5 5  Hip adduction 5 5  Hip internal rotation 5 5  Hip external rotation 5 5  Knee flexion 5 5  Knee extension 5 5  Ankle dorsiflexion 4+ 5  Ankle plantarflexion *   Ankle inversion 5   Ankle eversion 5    (Blank rows = not tested, *=not tested due to protocol)  FUNCTIONAL TESTS:  NT  GAIT: Distance walked: 20 m  Assistive device utilized: Crutches Level of assistance: Complete Independence Comments: step-through pattern    TODAY'S TREATMENT:                                                                                                                              DATE:  03/09/22 Therapeutic Exercise: to improve strength and mobility.  Demo, verbal and tactile cues throughout for technique.  Seated: L ankle circles x 20 L ankle pumps x 20 L ankle EV/IV x 20  L toe spreads, toe yoga    Manual Therapy: to decrease muscle spasm, pain and improve mobility.  STM to L gastroc, peroneals, tib posterior  02/27/22 Therapeutic Exercise: to improve strength and mobility.  Demo, verbal and tactile cues throughout for technique.  Seated: -L ankle circles x 20 -L ankle pumps x 20 -L EV/IV x 20 -L ankle ABC x 1 -L toe spread, toe yoga x 10 Supine SLR 2# RLE 2x10 S/L hip abduction 2# RLE 2x10 Prone hip extension 2# RLE 2x10  S/L clamshells GTB L x 10 with 5 sec hold Manual Therapy: to decrease muscle spasm, pain and improve mobility.  STM to L gastroc muscle belly - focused on medial side  02/27/22 Therapeutic Exercise: to improve strength and mobility.  Demo, verbal and tactile cues throughout for technique.  Seated L ankle pumps 2 x 10 Seated L ankle circles 2 x 10 Seated L ankle ABC x 1 Seated L ankle EV/IV 2 x 10 3 way L SLR supine, S/L, prone x 10 Seated hamstring stretch 2x30 sec Toe curls, toe yoga Manual  Therapy: to decrease muscle spasm, pain and improve mobility.  STM to L gastroc muscle belly - focused on medial side  02/19/22 Initial Evaluation HEP Introduction and Explanation:  Supine Ankle Circles CW/CC x10 Supine Ankle Pumps  x10 Seated Ankle Alphabet   Seated Heel Slide  x10    PATIENT EDUCATION:  Education details: Initial POC, Eval findings  Person educated: Patient Education method: Explanation, Demonstration, and Handouts Education comprehension: verbalized understanding and returned demonstration  HOME EXERCISE PROGRAM: Access Code: [email or web address] URL: [email or web address] Date: 02/19/2022 Prepared by: Verdene Lennert Romero-Perozo  Exercises - Supine Ankle Pumps  - 1 x daily - 7 x weekly - 2 sets - 10 reps - Supine Ankle Circles  - 1 x daily - 7 x weekly - 2 sets - 10 reps - Ankle Alphabet in Elevation  - 1 x daily - 7 x weekly - 2 sets - Seated Heel Slide  - 1 x daily - 7 x weekly - 2 sets - 10 reps   ASSESSMENT:  CLINICAL IMPRESSION: Pt showed no new issues from L ankle or foot. He has removed another wedge this week, so he is now down to 1 wedge in boot. He went to a work conference this week, doing a lot of walking with no increased pain but just reports of tightness in his calf. Continued working on L ankle mobility per protocol. The focus of today's session was STM to address continued tightness in the calf, peroneals, and tib posterior.   OBJECTIVE IMPAIRMENTS: Abnormal gait, decreased activity tolerance, decreased endurance, decreased knowledge of use of DME, decreased mobility, difficulty walking, decreased ROM, decreased strength, increased edema, impaired flexibility, and impaired sensation.   ACTIVITY LIMITATIONS: carrying, lifting, standing, squatting, stairs, transfers, bathing, dressing, locomotion level, and caring for others  PARTICIPATION LIMITATIONS: meal prep, cleaning, laundry, driving, shopping, community activity, occupation, and yard  work  PERSONAL FACTORS: Transportation and 1 comorbidity: Thyroid disease  are also affecting patient's functional outcome.   REHAB POTENTIAL: Excellent  CLINICAL DECISION MAKING: Stable/uncomplicated  EVALUATION COMPLEXITY: Low   GOALS: Goals reviewed with patient? Yes  SHORT TERM GOALS: Target date: 03/12/2022  Pt will be independent with original HEP Baseline: Goal status: IN PROGRESS  2.  Pt will reduce R ankle edema to increase overall ROM and weight-bearing tolerance.  Baseline: Figure 8: 66 cm  Goal status: IN PROGRESS  LONG TERM GOALS: Target date: 04/16/2022   Patient will be independent with ongoing/advanced HEP for self-management at home Baseline:  Goal status: IN PROGRESS  2.  Pt will report a score of >/=28/80 on the LEFS to demonstrate an increase in function and decrease in pain Baseline: 19/80 Goal status: IN PROGRESS  3.  Pt will increase R ankle ROM to WNL to normalize his gait and increase ability to ascend/descend stairs.  Baseline: see chart above Goal status: IN PROGRESS  4.  Pt will be able to fully weight-bear on both legs to increase his independence with daily activities  Baseline:  Goal status: IN PROGRESS  5.  Pt will be able to do his yard work with minimal limitations in his R ankle.  Baseline:  Goal status: IN PROGRESS   PLAN:w  PT FREQUENCY: 2x/week  PT DURATION: 8 weeks  PLANNED INTERVENTIONS: Therapeutic exercises, Therapeutic activity, Neuromuscular re-education, Balance training, Gait training, Patient/Family education, Self Care, Joint mobilization, Stair training, DME instructions, Dry Needling, Electrical stimulation, Cryotherapy, Moist heat, scar mobilization, Taping, Vasopneumatic device, Ultrasound, Manual therapy, and Re-evaluation  PLAN FOR NEXT SESSION:  rehab per protocol - begin gentle ROM exercises and joint mobs as needed, gentle scar mobilization   Kynlie Jane L Leocadia Idleman, PTA 03/09/2022, 11:02 AM

## 2022-03-16 ENCOUNTER — Ambulatory Visit: Payer: 59

## 2022-03-16 DIAGNOSIS — M25671 Stiffness of right ankle, not elsewhere classified: Secondary | ICD-10-CM | POA: Diagnosis not present

## 2022-03-16 DIAGNOSIS — R600 Localized edema: Secondary | ICD-10-CM | POA: Diagnosis not present

## 2022-03-16 DIAGNOSIS — M6281 Muscle weakness (generalized): Secondary | ICD-10-CM

## 2022-03-16 DIAGNOSIS — R262 Difficulty in walking, not elsewhere classified: Secondary | ICD-10-CM | POA: Diagnosis not present

## 2022-03-16 NOTE — Therapy (Signed)
OUTPATIENT PHYSICAL THERAPY TREATMENT   Patient Name: Louis Long MRN: [id number] DOB:1975/12/09, 47 y.o., male Today's Date: 03/16/2022  END OF SESSION:  PT End of Session - 03/16/22 1204     Visit Number 5    Date for PT Re-Evaluation 04/16/22    Authorization Type [facility] - VL: 25    PT Start Time 1102    PT Stop Time 1145    PT Time Calculation (min) 43 min    Activity Tolerance Patient tolerated treatment well    Behavior During Therapy WFL for tasks assessed/performed                 Past Medical History:  Diagnosis Date   History of kidney stones    Left ureteral calculus    S/P lobectomy of lung    Thyroid disease    Past Surgical History:  Procedure Laterality Date   CYSTOSCOPY WITH RETROGRADE PYELOGRAM, URETEROSCOPY AND STENT PLACEMENT Left 08/22/2012   Procedure: CYSTOSCOPY WITH RETROGRADE PYELOGRAM, LEFT URETEROSCOPY LASER AND LEFT DOUBLE J STENT PLACEMENT;  Surgeon: Ailene Rud, MD;  Location: [facility];  Service: Urology;  Laterality: Left;   EXTRACORPOREAL SHOCK WAVE LITHOTRIPSY Left 06-26-2012   HOLMIUM LASER APPLICATION Left [id number]   Procedure: HOLMIUM LASER APPLICATION;  Surgeon: Ailene Rud, MD;  Location: [facility];  Service: Urology;  Laterality: Left;   LUNG LOBECTOMY Right AGE 65   OPEN MIDDLE LOBECTOMY DUE TO ABSCESS   WISDOM TOOTH EXTRACTION     Patient Active Problem List   Diagnosis Date Noted   Achilles rupture, right, initial encounter 01/11/2022    PCP: Gaynelle Arabian, MD  REFERRING PROVIDER: Erle Crocker, MD  REFERRING DIAG: [phone] (ICD-10-CM) - S/P Achilles tendon repair   THERAPY DIAG:  Stiffness of right ankle, not elsewhere classified  Muscle weakness (generalized)  Localized edema  Difficulty in walking, not elsewhere classified  RATIONALE FOR EVALUATION AND TREATMENT: Rehabilitation  ONSET DATE: R Sx on 01/23/22   NEXT MD VISIT:  03/21/22   SUBJECTIVE:   SUBJECTIVE STATEMENT: Pt reports no pain, his calf continues to be tight d/t walking in boot. He has now taken out the last wedge.  PERTINENT HISTORY: Thyroid disease, Lobectomy of Lung PAIN:  Are you having pain? No  PRECAUTIONS: None  WEIGHT BEARING RESTRICTIONS: Yes in walking boot until next follow up   FALLS:  Has patient fallen in last 6 months? Yes. 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    OBJECTIVE:   DIAGNOSTIC FINDINGS:  01/11/22: [place] LIMITED JOINT SPACE STRUCTURES LOW RIGHT  01/05/22: DG Ankle Complete Right: There is no evidence of fracture, dislocation, or joint effusion. There is no evidence of arthropathy or other focal bone abnormality.Soft tissues are unremarkable.  PATIENT SURVEYS:  LEFS 19/80=23.8%  COGNITION: Overall cognitive status: Within functional limits for tasks assessed     SENSATION: Numbness on the back of the heel before and after surgery, especially right after wearing walking book.   EDEMA:  Figure 8: 66 cm   MUSCLE LENGTH: HS: bilat  moderate tightness Quad: WNL ITB: mild tightness Hip Flexor: WNL Piriformis: WNL   POSTURE: rounded shoulders and weight shift left  PALPATION: Non tender spots  LOWER EXTREMITY ROM:  Active ROM Right eval Left eval Right 03/16/22  Hip flexion     Hip extension     Hip abduction     Hip adduction     Hip internal rotation     Hip external rotation     Knee flexion     Knee extension     Ankle dorsiflexion 11  5  Ankle plantarflexion 55  55   Ankle inversion 10  41  Ankle eversion 10 *  36   (Blank rows = not tested, *=painful )  LOWER EXTREMITY MMT:  MMT Right eval Left eval  Hip flexion 5 5  Hip extension 5 5  Hip abduction 5 5  Hip adduction 5 5  Hip internal rotation 5 5  Hip external rotation 5 5  Knee flexion 5 5  Knee extension 5 5  Ankle dorsiflexion 4+ 5  Ankle plantarflexion *   Ankle inversion 5   Ankle eversion 5    (Blank rows = not tested, *=not tested due to protocol)  FUNCTIONAL TESTS:  NT  GAIT: Distance walked: 20 m  Assistive device utilized: Crutches Level of assistance: Complete Independence Comments: step-through pattern    TODAY'S TREATMENT:                                                                                                                              DATE: 03/16/22  Therapeutic Exercise: to improve strength and mobility.  Demo, verbal and tactile cues throughout for technique.  Nustep [id number] LE only Seated: R ankle circles x 20 R ankle pumps x 20 R ankle EV/IV x 20  R ankle edema 25 cm R ankle 4 way strengthening YTB x 20 each way  03/09/22 Therapeutic Exercise: to improve strength and mobility.  Demo, verbal and tactile cues throughout for technique.  Seated: L ankle circles x 20 L ankle pumps x 20 L ankle EV/IV x 20  L toe spreads, toe yoga    Manual Therapy: to decrease muscle spasm, pain and improve mobility.  STM to L gastroc, peroneals, tib posterior  02/27/22 Therapeutic Exercise: to improve strength and mobility.  Demo, verbal and tactile cues throughout for technique.  Seated: -L ankle circles x 20 -L ankle pumps x 20 -L EV/IV x 20 -L ankle ABC x 1 -L toe spread, toe yoga x 10 Supine SLR 2# RLE 2x10 S/L hip abduction 2# RLE 2x10 Prone hip extension 2# RLE 2x10  S/L clamshells GTB L x 10 with 5 sec hold Manual Therapy: to decrease muscle spasm, pain and improve mobility.  STM to L gastroc muscle belly - focused on medial  side  02/27/22 Therapeutic Exercise: to improve strength and mobility.  Demo, verbal and tactile cues throughout for technique.  Seated L ankle pumps  2 x 10 Seated L ankle circles 2 x 10 Seated L ankle ABC x 1 Seated L ankle EV/IV 2 x 10 3 way L SLR supine, S/L, prone x 10 Seated hamstring stretch 2x30 sec Toe curls, toe yoga Manual Therapy: to decrease muscle spasm, pain and improve mobility.  STM to L gastroc muscle belly - focused on medial side  02/19/22 Initial Evaluation HEP Introduction and Explanation:  Supine Ankle Circles CW/CC x10 Supine Ankle Pumps  x10 Seated Ankle Alphabet   Seated Heel Slide  x10    PATIENT EDUCATION:  Education details: Initial POC, Eval findings  Person educated: Patient Education method: Explanation, Demonstration, and Handouts Education comprehension: verbalized understanding and returned demonstration  HOME EXERCISE PROGRAM: Access Code: [email or web address] URL: [email or web address] Date: 03/16/2022 Prepared by: Clarene Essex  Exercises - Supine Ankle Pumps  - 1 x daily - 7 x weekly - 2 sets - 10 reps - Supine Ankle Circles  - 1 x daily - 7 x weekly - 2 sets - 10 reps - Ankle Alphabet in Elevation  - 1 x daily - 7 x weekly - 2 sets - Seated Heel Slide  - 1 x daily - 7 x weekly - 2 sets - 10 reps - Long Sitting Ankle Eversion with Resistance  - 1 x daily - 7 x weekly - 3 sets - 10 reps - Long Sitting Ankle Plantar Flexion with Resistance  - 1 x daily - 7 x weekly - 3 sets - 10 reps - Long Sitting Ankle Dorsiflexion with Anchored Resistance  - 1 x daily - 7 x weekly - 3 sets - 10 reps - Long Sitting Ankle Inversion with Resistance  - 1 x daily - 7 x weekly - 3 sets - 10 reps   ASSESSMENT:  CLINICAL IMPRESSION: Pt continues to response well to treatments. Per protocol we were able to progress to light TB 4 way ankle strengthening with no increased pain in R ankle. Ankle ROM has improved with IV, EV, and DF (specific measurements in  ROM chart). R ankle edema has decreased and he has no issues with WB in boot, meeting STG #2. He has no issues with HEP meeting STG #1 as well. Pt will F/U with his surgeon on Wednesday and hopefully have good report and be able to continue progressing toward function.  OBJECTIVE IMPAIRMENTS: Abnormal gait, decreased activity tolerance, decreased endurance, decreased knowledge of use of DME, decreased mobility, difficulty walking, decreased ROM, decreased strength, increased edema, impaired flexibility, and impaired sensation.   ACTIVITY LIMITATIONS: carrying, lifting, standing, squatting, stairs, transfers, bathing, dressing, locomotion level, and caring for others  PARTICIPATION LIMITATIONS: meal prep, cleaning, laundry, driving, shopping, community activity, occupation, and yard work  PERSONAL FACTORS: Transportation and 1 comorbidity: Thyroid disease  are also affecting patient's functional outcome.   REHAB POTENTIAL: Excellent  CLINICAL DECISION MAKING: Stable/uncomplicated  EVALUATION COMPLEXITY: Low   GOALS: Goals reviewed with patient? Yes  SHORT TERM GOALS: Target date: 03/12/2022  Pt will be independent with original HEP Baseline: Goal status: MET- 03/16/22  2.  Pt will reduce R ankle edema to increase overall ROM and weight-bearing tolerance.  Baseline: Figure 8: 66 cm  Goal status: MET- 03/16/22  LONG TERM GOALS: Target date: 04/16/2022   Patient will be independent with ongoing/advanced HEP for self-management at home Baseline:  Goal status: IN PROGRESS  2.  Pt will report a score of >/=28/80 on the LEFS to demonstrate an increase in function and decrease in pain Baseline: 19/80  Goal status: IN PROGRESS  3.  Pt will increase R ankle ROM to WNL to normalize his gait and increase ability to ascend/descend stairs.  Baseline: see chart above Goal status: IN PROGRESS- 03/16/22   4.  Pt will be able to fully weight-bear on both legs to increase his independence with daily  activities  Baseline:  Goal status: IN PROGRESS  5.  Pt will be able to do his yard work with minimal limitations in his R ankle.  Baseline:  Goal status: IN PROGRESS   PLAN:w  PT FREQUENCY: 2x/week  PT DURATION: 8 weeks  PLANNED INTERVENTIONS: Therapeutic exercises, Therapeutic activity, Neuromuscular re-education, Balance training, Gait training, Patient/Family education, Self Care, Joint mobilization, Stair training, DME instructions, Dry Needling, Electrical stimulation, Cryotherapy, Moist heat, scar mobilization, Taping, Vasopneumatic device, Ultrasound, Manual therapy, and Re-evaluation  PLAN FOR NEXT SESSION:  rehab per protocol - light 4 way ankle strengthening, AROM, light gastroc stretching   Artist Pais, PTA 03/16/2022, 12:04 PM

## 2022-03-21 DIAGNOSIS — L281 Prurigo nodularis: Secondary | ICD-10-CM | POA: Diagnosis not present

## 2022-03-21 DIAGNOSIS — L298 Other pruritus: Secondary | ICD-10-CM | POA: Diagnosis not present

## 2022-03-21 DIAGNOSIS — L538 Other specified erythematous conditions: Secondary | ICD-10-CM | POA: Diagnosis not present

## 2022-03-21 DIAGNOSIS — R208 Other disturbances of skin sensation: Secondary | ICD-10-CM | POA: Diagnosis not present

## 2022-03-21 DIAGNOSIS — Z789 Other specified health status: Secondary | ICD-10-CM | POA: Diagnosis not present

## 2022-03-23 ENCOUNTER — Ambulatory Visit: Payer: 59

## 2022-03-28 ENCOUNTER — Ambulatory Visit: Payer: 59

## 2022-03-28 DIAGNOSIS — M25671 Stiffness of right ankle, not elsewhere classified: Secondary | ICD-10-CM | POA: Diagnosis not present

## 2022-03-28 DIAGNOSIS — R600 Localized edema: Secondary | ICD-10-CM

## 2022-03-28 DIAGNOSIS — M6281 Muscle weakness (generalized): Secondary | ICD-10-CM

## 2022-03-28 DIAGNOSIS — R262 Difficulty in walking, not elsewhere classified: Secondary | ICD-10-CM | POA: Diagnosis not present

## 2022-03-28 NOTE — Therapy (Signed)
OUTPATIENT PHYSICAL THERAPY TREATMENT   Patient Name: Louis Long MRN: [id number] DOB:1975/05/28, 47 y.o., male Today's Date: 03/28/2022  END OF SESSION:  PT End of Session - 03/28/22 1516     Visit Number 6    Date for PT Re-Evaluation 04/16/22    Authorization Type [facility] - VL: 25    PT Start Time 1448    PT Stop Time 1531    PT Time Calculation (min) 43 min    Activity Tolerance Patient tolerated treatment well    Behavior During Therapy WFL for tasks assessed/performed                  Past Medical History:  Diagnosis Date   History of kidney stones    Left ureteral calculus    S/P lobectomy of lung    Thyroid disease    Past Surgical History:  Procedure Laterality Date   CYSTOSCOPY WITH RETROGRADE PYELOGRAM, URETEROSCOPY AND STENT PLACEMENT Left 08/22/2012   Procedure: CYSTOSCOPY WITH RETROGRADE PYELOGRAM, LEFT URETEROSCOPY LASER AND LEFT DOUBLE J STENT PLACEMENT;  Surgeon: Ailene Rud, MD;  Location: [place];  Service: Urology;  Laterality: Left;   EXTRACORPOREAL SHOCK WAVE LITHOTRIPSY Left 06-26-2012   HOLMIUM LASER APPLICATION Left [id number]   Procedure: HOLMIUM LASER APPLICATION;  Surgeon: Ailene Rud, MD;  Location: [facility];  Service: Urology;  Laterality: Left;   LUNG LOBECTOMY Right AGE 54   OPEN MIDDLE LOBECTOMY DUE TO ABSCESS   WISDOM TOOTH EXTRACTION     Patient Active Problem List   Diagnosis Date Noted   Achilles rupture, right, initial encounter 01/11/2022    PCP: Gaynelle Arabian, MD  REFERRING PROVIDER: Erle Crocker, MD  REFERRING DIAG: [id number] (ICD-10-CM) - S/P Achilles tendon repair   THERAPY DIAG:  Stiffness of right ankle, not elsewhere classified  Muscle weakness (generalized)  Localized edema  Difficulty in walking, not elsewhere classified  RATIONALE FOR EVALUATION AND TREATMENT: Rehabilitation  ONSET DATE: R Sx on 01/23/22   NEXT MD VISIT:  03/21/22   SUBJECTIVE:   SUBJECTIVE STATEMENT: Doing well, no restrictions from MD appointment.  PERTINENT HISTORY: Thyroid disease, Lobectomy of Lung PAIN:  Are you having pain? No  PRECAUTIONS: None  WEIGHT BEARING RESTRICTIONS: Yes in walking boot until next follow up   FALLS:  Has patient fallen in last 6 months? Yes. 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    OBJECTIVE:   DIAGNOSTIC FINDINGS:  01/11/22: [place] LIMITED JOINT SPACE STRUCTURES LOW RIGHT  01/05/22: DG Ankle Complete Right: There is no evidence of fracture, dislocation, or joint effusion. There is no evidence of arthropathy or other focal bone abnormality.Soft tissues are unremarkable.  PATIENT SURVEYS:  LEFS 19/80=23.8%  COGNITION: Overall cognitive status: Within functional limits for tasks assessed     SENSATION: Numbness on the back of the heel before and after surgery, especially right after wearing walking book.   EDEMA:  Figure 8: 66 cm   MUSCLE LENGTH: HS: bilat moderate tightness Quad: WNL ITB: mild tightness Hip Flexor: WNL Piriformis: WNL  POSTURE: rounded shoulders and weight shift left  PALPATION: Non tender spots  LOWER EXTREMITY ROM:  Active ROM Right eval Left eval Right 03/16/22  Hip flexion     Hip extension     Hip abduction     Hip adduction     Hip internal rotation     Hip external rotation     Knee flexion     Knee extension     Ankle dorsiflexion 11  5  Ankle plantarflexion 55  55  Ankle inversion 10  41  Ankle eversion 10 *  36   (Blank  rows = not tested, *=painful )  LOWER EXTREMITY MMT:  MMT Right eval Left eval  Hip flexion 5 5  Hip extension 5 5  Hip abduction 5 5  Hip adduction 5 5  Hip internal rotation 5 5  Hip external rotation 5 5  Knee flexion 5 5  Knee extension 5 5  Ankle dorsiflexion 4+ 5  Ankle plantarflexion *   Ankle inversion 5   Ankle eversion 5    (Blank rows = not tested, *=not tested due to protocol)  FUNCTIONAL TESTS:  NT  GAIT: Distance walked: 20 m  Assistive device utilized: Crutches Level of assistance: Complete Independence Comments: step-through pattern    TODAY'S TREATMENT:                                                                                                                              DATE: 03/28/22  Therapeutic Exercise: to improve strength and mobility.  Demo, verbal and tactile cues throughout for technique.  Bike L4x85min Heel raise x 10 UE support 4 way ankle RTB 2x10 Rt  NEUROMUSCULAR RE-EDUCATION: To improve posture, balance, and kinesthesia. Church pews on airex x 10 L SLS 2x30 sec R SLS 2x15 sec Ant/post WS on rocker board x 10  Manual Therapy:  Scar mobilization with instruction to patient  03/16/22  Therapeutic Exercise: to improve strength and mobility.  Demo, verbal and tactile cues throughout for technique.  Nustep L5x28min LE only Seated: R ankle circles x 20 R ankle pumps x 20 R ankle EV/IV x 20  R ankle edema 25 cm R ankle 4 way strengthening YTB x 20 each way  03/09/22 Therapeutic Exercise: to improve strength and mobility.  Demo, verbal and tactile cues throughout for technique.  Seated: L ankle circles x 20 L ankle pumps x 20 L ankle EV/IV x 20  L toe spreads, toe yoga    Manual Therapy: to decrease muscle spasm, pain and improve mobility.  STM to L gastroc, peroneals, tib posterior  02/27/22 Therapeutic Exercise: to improve strength and mobility.  Demo, verbal and tactile cues throughout for technique.  Seated: -L ankle  circles x 20 -L ankle pumps x 20 -L EV/IV x 20 -L ankle ABC x 1 -L toe spread, toe yoga x 10 Supine SLR 2# RLE 2x10 S/L hip abduction 2# RLE 2x10 Prone hip  extension 2# RLE 2x10  S/L clamshells GTB L x 10 with 5 sec hold Manual Therapy: to decrease muscle spasm, pain and improve mobility.  STM to L gastroc muscle belly - focused on medial side  02/27/22 Therapeutic Exercise: to improve strength and mobility.  Demo, verbal and tactile cues throughout for technique.  Seated L ankle pumps 2 x 10 Seated L ankle circles 2 x 10 Seated L ankle ABC x 1 Seated L ankle EV/IV 2 x 10 3 way L SLR supine, S/L, prone x 10 Seated hamstring stretch 2x30 sec Toe curls, toe yoga Manual Therapy: to decrease muscle spasm, pain and improve mobility.  STM to L gastroc muscle belly - focused on medial side  02/19/22 Initial Evaluation HEP Introduction and Explanation:  Supine Ankle Circles CW/CC x10 Supine Ankle Pumps  x10 Seated Ankle Alphabet   Seated Heel Slide  x10    PATIENT EDUCATION:  Education details: Initial POC, Eval findings  Person educated: Patient Education method: Explanation, Demonstration, and Handouts Education comprehension: verbalized understanding and returned demonstration  HOME EXERCISE PROGRAM: Access Code: [email or web address] URL: [email or web address] Date: 03/16/2022 Prepared by: Clarene Essex  Exercises - Supine Ankle Pumps  - 1 x daily - 7 x weekly - 2 sets - 10 reps - Supine Ankle Circles  - 1 x daily - 7 x weekly - 2 sets - 10 reps - Ankle Alphabet in Elevation  - 1 x daily - 7 x weekly - 2 sets - Seated Heel Slide  - 1 x daily - 7 x weekly - 2 sets - 10 reps - Long Sitting Ankle Eversion with Resistance  - 1 x daily - 7 x weekly - 3 sets - 10 reps - Long Sitting Ankle Plantar Flexion with Resistance  - 1 x daily - 7 x weekly - 3 sets - 10 reps - Long Sitting Ankle Dorsiflexion with Anchored Resistance  - 1 x daily - 7 x weekly - 3 sets - 10 reps - Long  Sitting Ankle Inversion with Resistance  - 1 x daily - 7 x weekly - 3 sets - 10 reps   ASSESSMENT:  CLINICAL IMPRESSION: Pt is now out of CAM boot with no restriction from MD. He showed a good tolerance for the exercises today with only instability noted with R SLS and weakness with heel raises. Instructed on scar tissue mobilization to prevent adhesions building up. HE would continue to benefit form skilled therapy.  OBJECTIVE IMPAIRMENTS: Abnormal gait, decreased activity tolerance, decreased endurance, decreased knowledge of use of DME, decreased mobility, difficulty walking, decreased ROM, decreased strength, increased edema, impaired flexibility, and impaired sensation.   ACTIVITY LIMITATIONS: carrying, lifting, standing, squatting, stairs, transfers, bathing, dressing, locomotion level, and caring for others  PARTICIPATION LIMITATIONS: meal prep, cleaning, laundry, driving, shopping, community activity, occupation, and yard work  PERSONAL FACTORS: Transportation and 1 comorbidity: Thyroid disease  are also affecting patient's functional outcome.   REHAB POTENTIAL: Excellent  CLINICAL DECISION MAKING: Stable/uncomplicated  EVALUATION COMPLEXITY: Low   GOALS: Goals reviewed with patient? Yes  SHORT TERM GOALS: Target date: 03/12/2022  Pt will be independent with original HEP Baseline: Goal status: MET- 03/16/22  2.  Pt will reduce R ankle edema to increase overall ROM and weight-bearing tolerance.  Baseline: Figure 8: 66 cm  Goal status: MET- 03/16/22  LONG TERM GOALS: Target date: 04/16/2022   Patient will be independent with ongoing/advanced HEP for self-management at home Baseline:  Goal status: IN PROGRESS  2.  Pt will  report a score of >/=28/80 on the LEFS to demonstrate an increase in function and decrease in pain Baseline: 19/80 Goal status: IN PROGRESS  3.  Pt will increase R ankle ROM to WNL to normalize his gait and increase ability to ascend/descend stairs.   Baseline: see chart above Goal status: IN PROGRESS- 03/16/22   4.  Pt will be able to fully weight-bear on both legs to increase his independence with daily activities  Baseline:  Goal status: IN PROGRESS  5.  Pt will be able to do his yard work with minimal limitations in his R ankle.  Baseline:  Goal status: IN PROGRESS   PLAN:w  PT FREQUENCY: 2x/week  PT DURATION: 8 weeks  PLANNED INTERVENTIONS: Therapeutic exercises, Therapeutic activity, Neuromuscular re-education, Balance training, Gait training, Patient/Family education, Self Care, Joint mobilization, Stair training, DME instructions, Dry Needling, Electrical stimulation, Cryotherapy, Moist heat, scar mobilization, Taping, Vasopneumatic device, Ultrasound, Manual therapy, and Re-evaluation  PLAN FOR NEXT SESSION:  rehab per protocol - 9 weeks post op   Artist Pais, PTA 03/28/2022, 4:33 PM

## 2022-03-29 ENCOUNTER — Other Ambulatory Visit (HOSPITAL_BASED_OUTPATIENT_CLINIC_OR_DEPARTMENT_OTHER): Payer: Self-pay

## 2022-03-30 ENCOUNTER — Ambulatory Visit: Payer: 59 | Admitting: Physical Therapy

## 2022-03-30 DIAGNOSIS — R600 Localized edema: Secondary | ICD-10-CM | POA: Diagnosis not present

## 2022-03-30 DIAGNOSIS — M6281 Muscle weakness (generalized): Secondary | ICD-10-CM

## 2022-03-30 DIAGNOSIS — M25671 Stiffness of right ankle, not elsewhere classified: Secondary | ICD-10-CM

## 2022-03-30 DIAGNOSIS — R262 Difficulty in walking, not elsewhere classified: Secondary | ICD-10-CM | POA: Diagnosis not present

## 2022-03-30 NOTE — Therapy (Signed)
OUTPATIENT PHYSICAL THERAPY TREATMENT   Patient Name: Louis Long MRN: [id number] DOB:Aug 06, 1975, 47 y.o., male Today's Date: 03/30/2022  END OF SESSION:  PT End of Session - 03/30/22 1111     Visit Number 7    Date for PT Re-Evaluation 04/16/22    Authorization Type [facility] - VL: 25    PT Start Time 1111   Pt arrived late   PT Stop Time 1150    PT Time Calculation (min) 39 min    Activity Tolerance Patient tolerated treatment well    Behavior During Therapy WFL for tasks assessed/performed                   Past Medical History:  Diagnosis Date   History of kidney stones    Left ureteral calculus    S/P lobectomy of lung    Thyroid disease    Past Surgical History:  Procedure Laterality Date   CYSTOSCOPY WITH RETROGRADE PYELOGRAM, URETEROSCOPY AND STENT PLACEMENT Left 08/22/2012   Procedure: CYSTOSCOPY WITH RETROGRADE PYELOGRAM, LEFT URETEROSCOPY LASER AND LEFT DOUBLE J STENT PLACEMENT;  Surgeon: Ailene Rud, MD;  Location: [place];  Service: Urology;  Laterality: Left;   EXTRACORPOREAL SHOCK WAVE LITHOTRIPSY Left 06-26-2012   HOLMIUM LASER APPLICATION Left [id number]   Procedure: HOLMIUM LASER APPLICATION;  Surgeon: Ailene Rud, MD;  Location: [facility];  Service: Urology;  Laterality: Left;   LUNG LOBECTOMY Right AGE 41   OPEN MIDDLE LOBECTOMY DUE TO ABSCESS   WISDOM TOOTH EXTRACTION     Patient Active Problem List   Diagnosis Date Noted   Achilles rupture, right, initial encounter 01/11/2022    PCP: Gaynelle Arabian, MD  REFERRING PROVIDER: Erle Crocker, MD  REFERRING DIAG: [phone] (ICD-10-CM) - S/P Achilles tendon repair (right)  THERAPY DIAG:  Stiffness of right ankle, not elsewhere classified  Muscle weakness (generalized)  Localized edema  Difficulty in walking, not elsewhere classified  RATIONALE FOR EVALUATION AND TREATMENT: Rehabilitation  ONSET DATE: R Sx on 01/23/22    NEXT MD VISIT: 05/02/22   SUBJECTIVE:   SUBJECTIVE STATEMENT: Pt reports he has been doing well out of the boot (D/C'd 03/21/22) and was able to walk ~2 miles last weekend. Still having difficulty with stairs.  PERTINENT HISTORY: Thyroid disease, Lobectomy of Lung PAIN:  Are you having pain? No  PRECAUTIONS: None  WEIGHT BEARING RESTRICTIONS: No  FALLS:  Has patient fallen in last 6 months? Yes. 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    OBJECTIVE:   DIAGNOSTIC FINDINGS:  01/11/22: [place] LIMITED JOINT SPACE STRUCTURES LOW RIGHT  01/05/22: DG Ankle Complete Right: There is no evidence of fracture, dislocation, or joint effusion. There is no evidence of arthropathy or other focal bone abnormality.Soft tissues are unremarkable.  PATIENT SURVEYS:  LEFS 19/80=23.8%  COGNITION: Overall cognitive status: Within functional limits for tasks assessed     SENSATION: Numbness on the back of the heel before and after surgery, especially right after wearing walking book.   EDEMA:  Figure 8: 66 cm   MUSCLE  LENGTH: HS: bilat moderate tightness Quad: WNL ITB: mild tightness Hip Flexor: WNL Piriformis: WNL   POSTURE: rounded shoulders and weight shift left  PALPATION: Non tender spots  LOWER EXTREMITY ROM:  Active ROM Right eval Right 03/16/22 Right 03/30/22  Hip flexion     Hip extension     Hip abduction     Hip adduction     Hip internal rotation     Hip external rotation     Knee flexion     Knee extension     Ankle dorsiflexion 11 5 17   Ankle  plantarflexion 55 55 52  Ankle inversion 10 41 37  Ankle eversion 10 * 36 20   (Blank rows = not tested, *=painful )  LOWER EXTREMITY MMT:  MMT Right eval Left eval  Hip flexion 5 5  Hip extension 5 5  Hip abduction 5 5  Hip adduction 5 5  Hip internal rotation 5 5  Hip external rotation 5 5  Knee flexion 5 5  Knee extension 5 5  Ankle dorsiflexion 4+ 5  Ankle plantarflexion *   Ankle inversion 5   Ankle eversion 5    (Blank rows = not tested, * = not tested due to protocol)  FUNCTIONAL TESTS:  NT  GAIT: Distance walked: 20 m  Assistive device utilized: Crutches Level of assistance: Complete Independence Comments: step-through pattern    TODAY'S TREATMENT:                                                                                                                              DATE:  03/30/22 THERAPEUTIC EXERCISE: to improve flexibility, strength and mobility.  Verbal and tactile cues throughout for technique.  Treadmill 2.2 mph x 6 min 6" lateral eccentric lowering with light L heel tap 2 x 10 B heel raises from level floor x 10 B slight negative heel raises from 4" step x 10 4" fwd eccentric lowering with light L heel tap 2 x 10 B Leg press 35# 2 x 10 B Ankle press 15# 2 x 10 Bil RTB pallof press in partial (L toe down for balance) to full R SLS x 10 each direction R SLS + 5-way tap to colored dots x 10   03/28/22  Therapeutic Exercise: to improve strength and mobility.  Demo, verbal and tactile cues throughout for technique.  Bike L4x31min Heel raise x 10 UE support 4 way ankle RTB 2x10 Rt  NEUROMUSCULAR RE-EDUCATION: To improve posture, balance, and kinesthesia. Church pews on airex x 10 L SLS 2x30 sec R SLS 2x15 sec Ant/post WS on rocker board x 10  Manual Therapy:  Scar mobilization with instruction to patient   03/16/22  Therapeutic Exercise: to improve strength and mobility.  Demo, verbal and tactile cues throughout for technique.  Nustep  L5x27min LE only Seated: R ankle circles x 20 R ankle pumps x 20 R ankle EV/IV x 20  R ankle edema 25  cm R ankle 4 way strengthening YTB x 20 each way   PATIENT EDUCATION:  Education details: HEP progression - heel raises,  Person educated: Patient Education method: Explanation, Demonstration, and Handouts Education comprehension: verbalized understanding and returned demonstration  HOME EXERCISE PROGRAM: Access Code: [email or web address] URL: [email or web address] Date: 03/30/2022 Prepared by: Annie Paras  Exercises - Single Leg Stance  - 1 x daily - 7 x weekly - 3 sets - 30 sec hold - Heel Raises with Counter Support  - 1 x daily - 7 x weekly - 3 sets - 10 reps - Long Sitting Ankle Eversion with Resistance  - 1 x daily - 7 x weekly - 3 sets - 10 reps - Long Sitting Ankle Plantar Flexion with Resistance  - 1 x daily - 7 x weekly - 3 sets - 10 reps - Long Sitting Ankle Dorsiflexion with Anchored Resistance  - 1 x daily - 7 x weekly - 3 sets - 10 reps - Long Sitting Ankle Inversion with Resistance  - 1 x daily - 7 x weekly - 3 sets - 10 reps - Standing Heel Raises  - 1 x daily - 3-4 x weekly - 2 sets - 10 reps - 3 sec hold - Lateral Step Down  - 1 x daily - 3-4 x weekly - 2 sets - 10 reps - 3 sec hold - Forward Step Down Touch with Heel  - 1 x daily - 3-4 x weekly - 2 sets - 10 reps - 3 sec hold - Single Leg Balance with Opposite Leg Star Reach  - 1 x daily - 7 x weekly - 2 sets - 10 reps - 3 sec hold   ASSESSMENT:  CLINICAL IMPRESSION: Arihant "Gershon Mussel" reports he has been doing well with walking out of the CAM boot but still feels limited with stairs, particularly on descent. R ankle ROM now WNL in all planes - LTG #4 met. Therapeutic exercises targeting strengthening progression in WBing as well as progression of proprioceptive training to improve ankle stability and control for gait on uneven surfaces as well as stair negotiation. HEP updated to reflect exercise progression.  Given good progress with PT and good compliance with HEP, discussed reducing frequency to 1x/wk to continue to progress HEP per protocol with pt in agreement.    OBJECTIVE IMPAIRMENTS: Abnormal gait, decreased activity tolerance, decreased endurance, decreased knowledge of use of DME, decreased mobility, difficulty walking, decreased ROM, decreased strength, increased edema, impaired flexibility, and impaired sensation.   ACTIVITY LIMITATIONS: carrying, lifting, standing, squatting, stairs, transfers, bathing, dressing, locomotion level, and caring for others  PARTICIPATION LIMITATIONS: meal prep, cleaning, laundry, driving, shopping, community activity, occupation, and yard work  PERSONAL FACTORS: Transportation and 1 comorbidity: Thyroid disease  are also affecting patient's functional outcome.   REHAB POTENTIAL: Excellent  CLINICAL DECISION MAKING: Stable/uncomplicated  EVALUATION COMPLEXITY: Low   GOALS: Goals reviewed with patient? Yes  SHORT TERM GOALS: Target date: 03/12/2022  Pt will be independent with original HEP Baseline: Goal status: MET  03/16/22  2.  Pt will reduce R ankle edema to increase overall ROM and weight-bearing tolerance.  Baseline: Figure 8: 66 cm  Goal status: MET  03/16/22  LONG TERM GOALS: Target date: 04/16/2022   Patient will be independent with ongoing/advanced HEP for self-management at home Baseline:  Goal status: IN PROGRESS  03/30/22 - updated today  2.  Pt will report a score of >/=28/80 on the LEFS to demonstrate an increase in function and decrease in pain  Baseline: 19/80 Goal status: IN PROGRESS  3.  Pt will increase R ankle ROM to WNL to normalize his gait and increase ability to ascend/descend stairs.  Baseline: see chart above Goal status: PARTIALLY MET  03/30/22 - Good gait pattern on level surfaces but still limited on stairs  4.  Pt will be able to fully weight-bear on both legs to increase his independence with daily activities   Baseline:  Goal status: MET  03/30/22  5.  Pt will be able to do his yard work with minimal limitations in his R ankle.  Baseline:  Goal status: IN PROGRESS   PLAN:  PT FREQUENCY: 1-2x/week  PT DURATION: 8 weeks  PLANNED INTERVENTIONS: Therapeutic exercises, Therapeutic activity, Neuromuscular re-education, Balance training, Gait training, Patient/Family education, Self Care, Joint mobilization, Stair training, DME instructions, Dry Needling, Electrical stimulation, Cryotherapy, Moist heat, scar mobilization, Taping, Vasopneumatic device, Ultrasound, Manual therapy, and Re-evaluation  PLAN FOR NEXT SESSION: rehab per protocol - 10 weeks post op as of 04/03/22 (sx 01/23/22)   Percival Spanish, PT 03/30/2022, 12:21 PM

## 2022-04-03 ENCOUNTER — Ambulatory Visit: Payer: 59

## 2022-04-03 DIAGNOSIS — R600 Localized edema: Secondary | ICD-10-CM | POA: Diagnosis not present

## 2022-04-03 DIAGNOSIS — M25671 Stiffness of right ankle, not elsewhere classified: Secondary | ICD-10-CM | POA: Diagnosis not present

## 2022-04-03 DIAGNOSIS — M6281 Muscle weakness (generalized): Secondary | ICD-10-CM | POA: Diagnosis not present

## 2022-04-03 DIAGNOSIS — R262 Difficulty in walking, not elsewhere classified: Secondary | ICD-10-CM

## 2022-04-03 NOTE — Therapy (Signed)
OUTPATIENT PHYSICAL THERAPY TREATMENT   Patient Name: Louis Long MRN: [id number] DOB:December 01, 1975, 47 y.o., male Today's Date: 04/03/2022  END OF SESSION:  PT End of Session - 04/03/22 0938     Visit Number 8    Date for PT Re-Evaluation 04/16/22    Authorization Type [facility] - VL: 25    PT Start Time 0932    PT Stop Time 1013    PT Time Calculation (min) 41 min    Activity Tolerance Patient tolerated treatment well    Behavior During Therapy WFL for tasks assessed/performed                    Past Medical History:  Diagnosis Date   History of kidney stones    Left ureteral calculus    S/P lobectomy of lung    Thyroid disease    Past Surgical History:  Procedure Laterality Date   CYSTOSCOPY WITH RETROGRADE PYELOGRAM, URETEROSCOPY AND STENT PLACEMENT Left 08/22/2012   Procedure: CYSTOSCOPY WITH RETROGRADE PYELOGRAM, LEFT URETEROSCOPY LASER AND LEFT DOUBLE J STENT PLACEMENT;  Surgeon: Ailene Rud, MD;  Location: [place];  Service: Urology;  Laterality: Left;   EXTRACORPOREAL SHOCK WAVE LITHOTRIPSY Left 06-26-2012   HOLMIUM LASER APPLICATION Left [id number]   Procedure: HOLMIUM LASER APPLICATION;  Surgeon: Ailene Rud, MD;  Location: [facility];  Service: Urology;  Laterality: Left;   LUNG LOBECTOMY Right AGE 36   OPEN MIDDLE LOBECTOMY DUE TO ABSCESS   WISDOM TOOTH EXTRACTION     Patient Active Problem List   Diagnosis Date Noted   Achilles rupture, right, initial encounter 01/11/2022    PCP: Gaynelle Arabian, MD  REFERRING PROVIDER: Erle Crocker, MD  REFERRING DIAG: [phone] (ICD-10-CM) - S/P Achilles tendon repair (right)  THERAPY DIAG:  Stiffness of right ankle, not elsewhere classified  Muscle weakness (generalized)  Localized edema  Difficulty in walking, not elsewhere classified  RATIONALE FOR EVALUATION AND TREATMENT: Rehabilitation  ONSET DATE: R Sx on 01/23/22   NEXT MD VISIT:  05/02/22   SUBJECTIVE:   SUBJECTIVE STATEMENT: Pt reports he has been doing well out of the boot (D/C'd 03/21/22) and was able to walk ~2 miles last weekend. Still having difficulty with stairs.  PERTINENT HISTORY: Thyroid disease, Lobectomy of Lung PAIN:  Are you having pain? No  PRECAUTIONS: None  WEIGHT BEARING RESTRICTIONS: No  FALLS:  Has patient fallen in last 6 months? Yes. 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    OBJECTIVE:   DIAGNOSTIC FINDINGS:  01/11/22: [place] LIMITED JOINT SPACE STRUCTURES LOW RIGHT  01/05/22: DG Ankle Complete Right: There is no evidence of fracture, dislocation, or joint effusion. There is no evidence of arthropathy or other focal bone abnormality.Soft tissues are unremarkable.  PATIENT SURVEYS:  LEFS 19/80=23.8%  COGNITION: Overall cognitive status: Within functional limits for tasks assessed     SENSATION: Numbness on the back of the heel before and after surgery, especially right after wearing walking book.   EDEMA:  Figure 8: 66 cm   MUSCLE LENGTH: HS: bilat  moderate tightness Quad: WNL ITB: mild tightness Hip Flexor: WNL Piriformis: WNL   POSTURE: rounded shoulders and weight shift left  PALPATION: Non tender spots  LOWER EXTREMITY ROM:  Active ROM Right eval Right 03/16/22 Right 03/30/22  Hip flexion     Hip extension     Hip abduction     Hip adduction     Hip internal rotation     Hip external rotation     Knee flexion     Knee extension     Ankle dorsiflexion 11 5 17   Ankle plantarflexion 55 55 52   Ankle inversion 10 41 37  Ankle eversion 10 * 36 20   (Blank rows = not tested, *=painful )  LOWER EXTREMITY MMT:  MMT Right eval Left eval  Hip flexion 5 5  Hip extension 5 5  Hip abduction 5 5  Hip adduction 5 5  Hip internal rotation 5 5  Hip external rotation 5 5  Knee flexion 5 5  Knee extension 5 5  Ankle dorsiflexion 4+ 5  Ankle plantarflexion *   Ankle inversion 5   Ankle eversion 5    (Blank rows = not tested, * = not tested due to protocol)  FUNCTIONAL TESTS:  NT  GAIT: Distance walked: 20 m  Assistive device utilized: Crutches Level of assistance: Complete Independence Comments: step-through pattern    TODAY'S TREATMENT:                                                                                                                              DATE: 04/03/22 THERAPEUTIC EXERCISE: to improve flexibility, strength and mobility.  Verbal and tactile cues throughout for technique.  Bike L2x34min Ecc forward step downs on 6' step x 10 Lateral step ups RLE 6' step 2x10 Heel raise x 10  Leg press 45# 2x10 Calf press 45# 2x10 4 way ankle R GTB 2x10  Standing R gastroc, then soleus gentle stretch on slant board 3x15 sec each NEUROMUSCULAR RE-EDUCATION: To improve posture, balance, and kinesthesia. R clock balance all the way around 3x Tandem stance bil x 30 sec 03/30/22 THERAPEUTIC EXERCISE: to improve flexibility, strength and mobility.  Verbal and tactile cues throughout for technique.  Treadmill 2.2 mph x 6 min 6" lateral eccentric lowering with light L heel tap 2 x 10 B heel raises from level floor x 10 B slight negative heel raises from 4" step x 10 4" fwd eccentric lowering with light L heel tap 2 x 10 B Leg press 35# 2 x 10 B Ankle press 15# 2 x 10 Bil RTB pallof press in partial (L toe down for balance) to full R SLS x 10 each direction R SLS + 5-way tap to colored dots x 10   03/28/22  Therapeutic Exercise: to improve strength and mobility.  Demo,  verbal and tactile cues throughout for technique.  Bike L4x42min Heel raise x 10 UE support 4 way ankle RTB 2x10  Rt  NEUROMUSCULAR RE-EDUCATION: To improve posture, balance, and kinesthesia. Church pews on airex x 10 L SLS 2x30 sec R SLS 2x15 sec Ant/post WS on rocker board x 10  Manual Therapy:  Scar mobilization with instruction to patient   03/16/22  Therapeutic Exercise: to improve strength and mobility.  Demo, verbal and tactile cues throughout for technique.  Nustep L5x87min LE only Seated: R ankle circles x 20 R ankle pumps x 20 R ankle EV/IV x 20  R ankle edema 25 cm R ankle 4 way strengthening YTB x 20 each way   PATIENT EDUCATION:  Education details: HEP progression - heel raises,  Person educated: Patient Education method: Explanation, Demonstration, and Handouts Education comprehension: verbalized understanding and returned demonstration  HOME EXERCISE PROGRAM: Access Code: [email or web address] URL: [email or web address] Date: 03/30/2022 Prepared by: Annie Paras  Exercises - Single Leg Stance  - 1 x daily - 7 x weekly - 3 sets - 30 sec hold - Heel Raises with Counter Support  - 1 x daily - 7 x weekly - 3 sets - 10 reps - Long Sitting Ankle Eversion with Resistance  - 1 x daily - 7 x weekly - 3 sets - 10 reps - Long Sitting Ankle Plantar Flexion with Resistance  - 1 x daily - 7 x weekly - 3 sets - 10 reps - Long Sitting Ankle Dorsiflexion with Anchored Resistance  - 1 x daily - 7 x weekly - 3 sets - 10 reps - Long Sitting Ankle Inversion with Resistance  - 1 x daily - 7 x weekly - 3 sets - 10 reps - Standing Heel Raises  - 1 x daily - 3-4 x weekly - 2 sets - 10 reps - 3 sec hold - Lateral Step Down  - 1 x daily - 3-4 x weekly - 2 sets - 10 reps - 3 sec hold - Forward Step Down Touch with Heel  - 1 x daily - 3-4 x weekly - 2 sets - 10 reps - 3 sec hold - Single Leg Balance with Opposite Leg Star Reach  - 1 x daily - 7 x weekly - 2 sets - 10 reps - 3 sec  hold   ASSESSMENT:  CLINICAL IMPRESSION: Keval "Gershon Mussel"  continues to respond well to the progression of exercises. We continued to focus on proprioceptive exercises to improve muscular co-contractions and strengthening to improve ankle stability. He completed interventions with no increased pain. We also progressed 4 way ankle to green TB. He denies having any issues with blowing his grass at home outside and other light activities progressing toward LTG #5.   OBJECTIVE IMPAIRMENTS: Abnormal gait, decreased activity tolerance, decreased endurance, decreased knowledge of use of DME, decreased mobility, difficulty walking, decreased ROM, decreased strength, increased edema, impaired flexibility, and impaired sensation.   ACTIVITY LIMITATIONS: carrying, lifting, standing, squatting, stairs, transfers, bathing, dressing, locomotion level, and caring for others  PARTICIPATION LIMITATIONS: meal prep, cleaning, laundry, driving, shopping, community activity, occupation, and yard work  PERSONAL FACTORS: Transportation and 1 comorbidity: Thyroid disease  are also affecting patient's functional outcome.   REHAB POTENTIAL: Excellent  CLINICAL DECISION MAKING: Stable/uncomplicated  EVALUATION COMPLEXITY: Low   GOALS: Goals reviewed with patient? Yes  SHORT TERM GOALS: Target date: 03/12/2022  Pt will be independent with original HEP Baseline: Goal status: MET  03/16/22  2.  Pt will reduce R ankle edema to increase overall ROM and weight-bearing tolerance.  Baseline: Figure 8: 66 cm  Goal status: MET  03/16/22  LONG TERM GOALS: Target date: 04/16/2022   Patient will be independent with ongoing/advanced HEP for self-management at home Baseline:  Goal status: IN PROGRESS  03/30/22 - updated today  2.  Pt will report a score of >/=28/80 on the LEFS to demonstrate an increase in function and decrease in pain Baseline: 19/80 Goal status: IN PROGRESS  3.  Pt will increase R ankle ROM to WNL to  normalize his gait and increase ability to ascend/descend stairs.  Baseline: see chart above Goal status: PARTIALLY MET  03/30/22 - Good gait pattern on level surfaces but still limited on stairs  4.  Pt will be able to fully weight-bear on both legs to increase his independence with daily activities  Baseline:  Goal status: MET  03/30/22  5.  Pt will be able to do his yard work with minimal limitations in his R ankle.  Baseline:  Goal status: IN PROGRESS- 04/03/22 no issues with light yard work(blowing grass from mowing)   PLAN:  PT FREQUENCY: 1-2x/week  PT DURATION: 8 weeks  PLANNED INTERVENTIONS: Therapeutic exercises, Therapeutic activity, Neuromuscular re-education, Balance training, Gait training, Patient/Family education, Self Care, Joint mobilization, Stair training, DME instructions, Dry Needling, Electrical stimulation, Cryotherapy, Moist heat, scar mobilization, Taping, Vasopneumatic device, Ultrasound, Manual therapy, and Re-evaluation  PLAN FOR NEXT SESSION: rehab per protocol - 10 weeks post op as of 04/03/22 (sx 01/23/22)   Artist Pais, PTA 04/03/2022, 10:14 AM

## 2022-04-19 ENCOUNTER — Ambulatory Visit: Payer: 59 | Attending: Orthopaedic Surgery | Admitting: Physical Therapy

## 2022-04-19 DIAGNOSIS — M25671 Stiffness of right ankle, not elsewhere classified: Secondary | ICD-10-CM | POA: Insufficient documentation

## 2022-04-19 DIAGNOSIS — R600 Localized edema: Secondary | ICD-10-CM | POA: Insufficient documentation

## 2022-04-19 DIAGNOSIS — R262 Difficulty in walking, not elsewhere classified: Secondary | ICD-10-CM | POA: Insufficient documentation

## 2022-04-19 DIAGNOSIS — M6281 Muscle weakness (generalized): Secondary | ICD-10-CM | POA: Insufficient documentation

## 2022-04-23 ENCOUNTER — Other Ambulatory Visit (HOSPITAL_BASED_OUTPATIENT_CLINIC_OR_DEPARTMENT_OTHER): Payer: Self-pay

## 2022-04-23 ENCOUNTER — Encounter: Payer: Self-pay | Admitting: *Deleted

## 2022-04-26 ENCOUNTER — Encounter: Payer: 59 | Admitting: Physical Therapy

## 2022-05-02 DIAGNOSIS — S86091D Other specified injury of right Achilles tendon, subsequent encounter: Secondary | ICD-10-CM | POA: Diagnosis not present

## 2022-05-03 ENCOUNTER — Ambulatory Visit: Payer: 59 | Admitting: Physical Therapy

## 2022-05-03 ENCOUNTER — Encounter: Payer: Self-pay | Admitting: Physical Therapy

## 2022-05-03 DIAGNOSIS — M6281 Muscle weakness (generalized): Secondary | ICD-10-CM | POA: Diagnosis not present

## 2022-05-03 DIAGNOSIS — R600 Localized edema: Secondary | ICD-10-CM

## 2022-05-03 DIAGNOSIS — M25671 Stiffness of right ankle, not elsewhere classified: Secondary | ICD-10-CM | POA: Diagnosis not present

## 2022-05-03 DIAGNOSIS — R262 Difficulty in walking, not elsewhere classified: Secondary | ICD-10-CM | POA: Diagnosis not present

## 2022-05-03 NOTE — Therapy (Signed)
OUTPATIENT PHYSICAL THERAPY TREATMENT   Patient Name: Louis Long MRN: [id number] DOB:Mar 31, 1975, 47 y.o., male Today's Date: 05/03/2022  END OF SESSION:  PT End of Session - 05/03/22 0938     Visit Number 9    Date for PT Re-Evaluation --    Authorization Type [facility] - VL: 25    PT Start Time 0938    PT Stop Time 1009    PT Time Calculation (min) 31 min    Activity Tolerance Patient tolerated treatment well    Behavior During Therapy WFL for tasks assessed/performed                    Past Medical History:  Diagnosis Date   History of kidney stones    Left ureteral calculus    S/P lobectomy of lung    Thyroid disease    Past Surgical History:  Procedure Laterality Date   CYSTOSCOPY WITH RETROGRADE PYELOGRAM, URETEROSCOPY AND STENT PLACEMENT Left 08/22/2012   Procedure: CYSTOSCOPY WITH RETROGRADE PYELOGRAM, LEFT URETEROSCOPY LASER AND LEFT DOUBLE J STENT PLACEMENT;  Surgeon: Kathi Ludwig, MD;  Location: [facility];  Service: Urology;  Laterality: Left;   EXTRACORPOREAL SHOCK WAVE LITHOTRIPSY Left 06-26-2012   HOLMIUM LASER APPLICATION Left 08/22/2012   Procedure: HOLMIUM LASER APPLICATION;  Surgeon: Kathi Ludwig, MD;  Location: [facility];  Service: Urology;  Laterality: Left;   LUNG LOBECTOMY Right AGE 33   OPEN MIDDLE LOBECTOMY DUE TO ABSCESS   WISDOM TOOTH EXTRACTION     Patient Active Problem List   Diagnosis Date Noted   Achilles rupture, right, initial encounter 01/11/2022    PCP: Blair Heys, MD  REFERRING PROVIDER: Terance Hart, MD  REFERRING DIAG: [phone] (ICD-10-CM) - S/P Achilles tendon repair (right)  THERAPY DIAG:  Stiffness of right ankle, not elsewhere classified  Muscle weakness (generalized)  Localized edema  Difficulty in walking, not elsewhere classified  RATIONALE FOR EVALUATION AND TREATMENT: Rehabilitation  ONSET DATE: R Sx on 01/23/22   NEXT MD VISIT:   Released by MD   SUBJECTIVE:   SUBJECTIVE STATEMENT: Pt reports his f/u with the MD went well yesterday - cleared to wrap up PT as he feels ready.  PERTINENT HISTORY: Thyroid disease, Lobectomy of Lung PAIN:  Are you having pain? No  PRECAUTIONS: None  WEIGHT BEARING RESTRICTIONS: No  FALLS:  Has patient fallen in last 6 months? Yes. 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    OBJECTIVE:   DIAGNOSTIC FINDINGS:  01/11/22: [place] LIMITED JOINT SPACE STRUCTURES LOW RIGHT  01/05/22: DG Ankle Complete Right: There is no evidence of fracture, dislocation, or joint effusion. There is no evidence of arthropathy or other focal bone abnormality.Soft tissues are unremarkable.  PATIENT SURVEYS:  LEFS 19/80=23.8% 05/03/22: 73 / 80 = 91.3 %  COGNITION: Overall cognitive status: Within functional limits for tasks assessed     SENSATION: Numbness on the back of the heel before and after surgery, especially right after wearing walking book.   EDEMA:  Figure 8: 66 cm   MUSCLE  LENGTH: HS: bilat moderate tightness Quad: WNL ITB: mild tightness Hip Flexor: WNL Piriformis: WNL   POSTURE: rounded shoulders and weight shift left  PALPATION: Non tender spots  LOWER EXTREMITY ROM:  Active ROM Right eval Right 03/16/22 Right 03/30/22 Right 05/03/22  Hip flexion      Hip extension      Hip abduction      Hip adduction      Hip internal rotation      Hip external rotation      Knee flexion      Knee extension      Ankle dorsiflexion 11 5 17 22   Ankle plantarflexion 55  55 52 55  Ankle inversion 10 41 37 41  Ankle eversion 10 * 36 20 28   (Blank rows = not tested, *=painful )  LOWER EXTREMITY MMT:  MMT Right eval Left eval Right 05/03/22 Left 05/03/22  Hip flexion 5 5    Hip extension 5 5    Hip abduction 5 5    Hip adduction 5 5    Hip internal rotation 5 5    Hip external rotation 5 5    Knee flexion 5 5    Knee extension 5 5    Ankle dorsiflexion 4+ 5 5 5   Ankle plantarflexion *  4 5  Ankle inversion 5  5 5   Ankle eversion 5  5 5    (Blank rows = not tested, * = not tested due to protocol)  FUNCTIONAL TESTS:  NT  GAIT: Distance walked: 20 m  Assistive device utilized: Crutches Level of assistance: Complete Independence Comments: step-through pattern    TODAY'S TREATMENT:                                                                                                                              DATE:  05/03/22 THERAPEUTIC ACTIVITIES: ROM  MMT LEFS: 73 / 80 = 91.3 % SLS: R = 32.47 sec, L 16.06 sec Goal assessment  THERAPEUTIC EXERCISE: to improve flexibility, strength and mobility.  Verbal and tactile cues throughout for technique. R SLS blue TB pallof press x 10 bil B concentric/R eccentric heel raise on edge of 4" step today2 x 10 R SLS + opp UE reach in star pattern   04/03/22 THERAPEUTIC EXERCISE: to improve flexibility, strength and mobility.  Verbal and tactile cues throughout for technique.  Bike L2x8min Ecc forward step downs on 6' step x 10 Lateral step ups RLE 6' step 2x10 Heel raise x 10  Leg press 45# 2x10 Calf press 45# 2x10 4 way ankle R GTB 2x10  Standing R gastroc, then soleus gentle stretch on slant board 3x15 sec each NEUROMUSCULAR RE-EDUCATION: To improve posture, balance, and kinesthesia. R clock balance all the way around 3x Tandem stance bil x 30 sec   03/30/22 THERAPEUTIC EXERCISE: to improve flexibility, strength and mobility.  Verbal and tactile cues throughout for technique.  Treadmill 2.2 mph  x  6 min 6" lateral eccentric lowering with light L heel tap 2 x 10 B heel raises from level floor x 10 B slight negative heel raises from 4" step x 10 4" fwd eccentric lowering with light L heel tap 2 x 10 B Leg press 35# 2 x 10 B Ankle press 15# 2 x 10 Bil RTB pallof press in partial (L toe down for balance) to full R SLS x 10 each direction R SLS + 5-way tap to colored dots x 10   PATIENT EDUCATION:  Education details: HEP review, HEP progression, HEP consolidation, and recommended frequency for ongoing HEP at discharge to prevent loss of gains achieved with PT Person educated: Patient Education method: Explanation, Demonstration, and MedBridgeGO code updated Education comprehension: verbalized understanding and returned demonstration  HOME EXERCISE PROGRAM: Access Code: [email or web address] URL: [email or web address] Date: 05/03/2022 Prepared by: Glenetta Hew  Exercises - Single Leg Stance  - 1 x daily - 7 x weekly - 3 sets - 30 sec hold - Long Sitting Ankle Eversion with Resistance  - 1 x daily - 7 x weekly - 3 sets - 10 reps - Long Sitting Ankle Plantar Flexion with Resistance  - 1 x daily - 7 x weekly - 3 sets - 10 reps - Long Sitting Ankle Dorsiflexion with Anchored Resistance  - 1 x daily - 7 x weekly - 3 sets - 10 reps - Long Sitting Ankle Inversion with Resistance  - 1 x daily - 7 x weekly - 3 sets - 10 reps - Lateral Step Down  - 1 x daily - 3 x weekly - 2 sets - 10 reps - 3 sec hold - Forward Step Down Touch with Heel  - 1 x daily - 3 x weekly - 2 sets - 10 reps - 3 sec hold - Single Leg Balance with Opposite Leg Star Reach  - 1 x daily - 3 x weekly - 2 sets - 10 reps - 3 sec hold - Single-Leg Anti-Rotation Press With Anchored Resistance  - 1 x daily - 3 x weekly - 2 sets - 10 reps - 3 sec hold - Eccentric Heel Lowering on Step  - 1 x daily - 3 x weekly - 2 sets - 10 reps - 3-5 sec hold - Single Leg Balance with Opposite Side Arm Star Reach  - 1 x daily - 3 x weekly -  2 sets - 10 reps - 3 sec hold   ASSESSMENT:  CLINICAL IMPRESSION: Tyquan "Elijah Birk" returns to physical therapy for the first time in 1 month, 1 day following his postop visit with MD.  He reports he was relieved by the MD with no further restrictions on activity.  He reports he been able to do all normal activities at home including navigating stairs reciprocally and working in the yard without difficulty.  Right ankle ROM now WNL and strength 5/5 except plantarflexion 4/5 in weightbearing.  HEP reviewed and updated to reflect areas for further improvement to help with his golf game.  All goals now met and Elijah Birk feels ready to transition to his HEP, therefore will proceed with discharge from physical therapy for this episode.  OBJECTIVE IMPAIRMENTS: Abnormal gait, decreased activity tolerance, decreased endurance, decreased knowledge of use of DME, decreased mobility, difficulty walking, decreased ROM, decreased strength, increased edema, impaired flexibility, and impaired sensation.   ACTIVITY LIMITATIONS: carrying, lifting, standing, squatting, stairs, transfers, bathing, dressing, locomotion level, and caring for others  PARTICIPATION LIMITATIONS: meal prep, cleaning, laundry, driving, shopping, community  activity, occupation, and yard work  PERSONAL FACTORS: Transportation and 1 comorbidity: Thyroid disease  are also affecting patient's functional outcome.   REHAB POTENTIAL: Excellent  CLINICAL DECISION MAKING: Stable/uncomplicated  EVALUATION COMPLEXITY: Low   GOALS: Goals reviewed with patient? Yes  SHORT TERM GOALS: Target date: 03/12/2022  Pt will be independent with original HEP Baseline: Goal status: MET  03/16/22  2.  Pt will reduce R ankle edema to increase overall ROM and weight-bearing tolerance.  Baseline: Figure 8: 66 cm  Goal status: MET  03/16/22  LONG TERM GOALS: Target date: 04/16/2022   Patient will be independent with ongoing/advanced HEP for self-management at  home Baseline:  Goal status: MET  05/03/22   2.  Pt will report a score of >/= 28/80 on the LEFS to demonstrate an increase in function and decrease in pain Baseline: 19/80 Goal status: MET  05/03/22 - 73 / 80 = 91.3 %  3.  Pt will increase R ankle ROM to WNL to normalize his gait and increase ability to ascend/descend stairs.  Baseline: see chart above Goal status: MET  05/03/22 - Good gait pattern on level surfaces but still limited on stairs  4.  Pt will be able to fully weight-bear on both legs to increase his independence with daily activities  Baseline:  Goal status: MET  03/30/22  5.  Pt will be able to do his yard work with minimal limitations in his R ankle.  Baseline:  Goal status: MET  05/03/22    PLAN:  PT FREQUENCY: 1-2x/week  PT DURATION: 8 weeks  PLANNED INTERVENTIONS: Therapeutic exercises, Therapeutic activity, Neuromuscular re-education, Balance training, Gait training, Patient/Family education, Self Care, Joint mobilization, Stair training, DME instructions, Dry Needling, Electrical stimulation, Cryotherapy, Moist heat, scar mobilization, Taping, Vasopneumatic device, Ultrasound, Manual therapy, and Re-evaluation  PLAN FOR NEXT SESSION: Transition to HEP, discharge from physical therapy   PHYSICAL THERAPY DISCHARGE SUMMARY  Visits from Start of Care: 9  Current functional level related to goals / functional outcomes:   Refer to above clinical impression and goal assessment.   Remaining deficits:   None.   Education / Equipment:   HEP   Patient agrees to discharge. Patient goals were met. Patient is being discharged due to meeting the stated rehab goals.    Marry Guan, PT 05/03/2022, 12:12 PM

## 2022-05-28 ENCOUNTER — Other Ambulatory Visit (HOSPITAL_BASED_OUTPATIENT_CLINIC_OR_DEPARTMENT_OTHER): Payer: Self-pay

## 2022-05-28 MED ORDER — SERTRALINE HCL 50 MG PO TABS
50.0000 mg | ORAL_TABLET | Freq: Every evening | ORAL | 1 refills | Status: DC
  Filled 2022-05-28 – 2022-07-09 (×4): qty 30, 30d supply, fill #0
  Filled 2022-08-01 – 2022-08-16 (×2): qty 30, 30d supply, fill #1

## 2022-06-11 ENCOUNTER — Other Ambulatory Visit (HOSPITAL_BASED_OUTPATIENT_CLINIC_OR_DEPARTMENT_OTHER): Payer: Self-pay

## 2022-06-12 ENCOUNTER — Other Ambulatory Visit (HOSPITAL_BASED_OUTPATIENT_CLINIC_OR_DEPARTMENT_OTHER): Payer: Self-pay

## 2022-06-27 ENCOUNTER — Other Ambulatory Visit (HOSPITAL_BASED_OUTPATIENT_CLINIC_OR_DEPARTMENT_OTHER): Payer: Self-pay

## 2022-07-02 ENCOUNTER — Other Ambulatory Visit (HOSPITAL_BASED_OUTPATIENT_CLINIC_OR_DEPARTMENT_OTHER): Payer: Self-pay

## 2022-07-03 ENCOUNTER — Other Ambulatory Visit (HOSPITAL_BASED_OUTPATIENT_CLINIC_OR_DEPARTMENT_OTHER): Payer: Self-pay

## 2022-07-05 ENCOUNTER — Other Ambulatory Visit (HOSPITAL_BASED_OUTPATIENT_CLINIC_OR_DEPARTMENT_OTHER): Payer: Self-pay

## 2022-07-05 DIAGNOSIS — E782 Mixed hyperlipidemia: Secondary | ICD-10-CM | POA: Diagnosis not present

## 2022-07-05 DIAGNOSIS — Z125 Encounter for screening for malignant neoplasm of prostate: Secondary | ICD-10-CM | POA: Diagnosis not present

## 2022-07-05 DIAGNOSIS — Z6838 Body mass index (BMI) 38.0-38.9, adult: Secondary | ICD-10-CM | POA: Diagnosis not present

## 2022-07-05 DIAGNOSIS — Z0000 Encounter for general adult medical examination without abnormal findings: Secondary | ICD-10-CM | POA: Diagnosis not present

## 2022-07-05 DIAGNOSIS — E039 Hypothyroidism, unspecified: Secondary | ICD-10-CM | POA: Diagnosis not present

## 2022-07-05 DIAGNOSIS — R7309 Other abnormal glucose: Secondary | ICD-10-CM | POA: Diagnosis not present

## 2022-07-05 MED ORDER — LEVOTHYROXINE SODIUM 75 MCG PO TABS
75.0000 ug | ORAL_TABLET | Freq: Every morning | ORAL | 4 refills | Status: AC
  Filled 2022-07-05: qty 90, 90d supply, fill #0
  Filled 2022-10-12 – 2022-10-21 (×2): qty 30, 30d supply, fill #0

## 2022-07-05 MED ORDER — SERTRALINE HCL 50 MG PO TABS
50.0000 mg | ORAL_TABLET | Freq: Every day | ORAL | 4 refills | Status: AC
  Filled 2022-07-06: qty 30, 30d supply, fill #0
  Filled 2022-07-09 – 2022-07-10 (×2): qty 90, 90d supply, fill #0
  Filled 2022-07-11: qty 30, 30d supply, fill #0
  Filled 2022-07-12: qty 90, 90d supply, fill #0
  Filled 2022-09-11: qty 30, 30d supply, fill #0
  Filled 2022-10-12: qty 30, 30d supply, fill #1

## 2022-07-06 ENCOUNTER — Other Ambulatory Visit (HOSPITAL_BASED_OUTPATIENT_CLINIC_OR_DEPARTMENT_OTHER): Payer: Self-pay

## 2022-07-09 ENCOUNTER — Other Ambulatory Visit (HOSPITAL_BASED_OUTPATIENT_CLINIC_OR_DEPARTMENT_OTHER): Payer: Self-pay

## 2022-07-10 ENCOUNTER — Other Ambulatory Visit: Payer: Self-pay

## 2022-07-11 ENCOUNTER — Other Ambulatory Visit (HOSPITAL_BASED_OUTPATIENT_CLINIC_OR_DEPARTMENT_OTHER): Payer: Self-pay

## 2022-07-13 ENCOUNTER — Other Ambulatory Visit (HOSPITAL_BASED_OUTPATIENT_CLINIC_OR_DEPARTMENT_OTHER): Payer: Self-pay

## 2022-08-14 ENCOUNTER — Other Ambulatory Visit (HOSPITAL_BASED_OUTPATIENT_CLINIC_OR_DEPARTMENT_OTHER): Payer: Self-pay

## 2022-08-15 ENCOUNTER — Ambulatory Visit
Admission: EM | Admit: 2022-08-15 | Discharge: 2022-08-15 | Disposition: A | Discharge status: Home / Self Care | Payer: BC Managed Care – PPO | Attending: Internal Medicine | Admitting: Internal Medicine

## 2022-08-15 DIAGNOSIS — H6123 Impacted cerumen, bilateral: Secondary | ICD-10-CM | POA: Insufficient documentation

## 2022-08-15 DIAGNOSIS — Z1152 Encounter for screening for COVID-19: Secondary | ICD-10-CM | POA: Insufficient documentation

## 2022-08-15 DIAGNOSIS — H6993 Unspecified Eustachian tube disorder, bilateral: Secondary | ICD-10-CM | POA: Insufficient documentation

## 2022-08-15 DIAGNOSIS — J069 Acute upper respiratory infection, unspecified: Secondary | ICD-10-CM | POA: Insufficient documentation

## 2022-08-15 MED ORDER — BENZONATATE 100 MG PO CAPS
100.0000 mg | ORAL_CAPSULE | Freq: Three times a day (TID) | ORAL | 0 refills | Status: AC
  Filled 2022-08-15: qty 21, 7d supply, fill #0

## 2022-08-15 NOTE — ED Triage Notes (Signed)
Pt presents with c/o nasal and head congestion, ear fullness x 2days. Pt states he just came back from traveling and states his ears have not been able to pop. States he has taken Flonase and Sudafed.

## 2022-08-15 NOTE — ED Provider Notes (Signed)
[facility]    CSN: [id number] Arrival date & time: 08/15/22  1819      History   Chief Complaint Chief Complaint  Patient presents with  . Nasal Congestion  . Ear Fullness    HPI Louis Long is a 47 y.o. male.   Patient presents to urgent care for evaluation of productive cough, nasal congestion, chest congestion, and ear fullness sensation that started 3 days ago. Recently traveled home via airplane from [place] yesterday.  Complaining of bilateral ear fullness and states that both of his ears have not been able to pop ever since he has come home to [place] with lower elevation/altitude.  No recent drainage from the ears.  Wears hearing aids at baseline for decreased hearing, reports worsening decreased hearing to both ears since ear fullness sensation.  No recent fevers, chills, body aches, shortness of breath, chest pain, N/V/D, abdominal pain, or rash.  No recent antibiotic or steroid use.  No history of chronic respiratory problems or recent known sick contacts with similar cough cold symptoms.  Never smoker, denies drug use.  Using Flonase, Sudafed, and other over-the-counter medications without relief.   Ear Fullness   Past Medical History:  Diagnosis Date  . History of kidney stones   . Left ureteral calculus   . S/P lobectomy of lung   . Thyroid disease     Patient Active Problem List   Diagnosis Date Noted  . Achilles rupture, right, initial encounter 01/11/2022    Past Surgical History:  Procedure Laterality Date  . CYSTOSCOPY WITH RETROGRADE PYELOGRAM, URETEROSCOPY AND STENT PLACEMENT Left 08/22/2012   Procedure: CYSTOSCOPY WITH RETROGRADE PYELOGRAM, LEFT URETEROSCOPY LASER AND LEFT DOUBLE J STENT PLACEMENT;  Surgeon: Kathi Ludwig, MD;  Location: [facility];  Service: Urology;  Laterality: Left;  . EXTRACORPOREAL SHOCK WAVE LITHOTRIPSY Left 06-26-2012  . HOLMIUM LASER APPLICATION Left 08/22/2012   Procedure: HOLMIUM  LASER APPLICATION;  Surgeon: Kathi Ludwig, MD;  Location: [facility];  Service: Urology;  Laterality: Left;  . LUNG LOBECTOMY Right AGE 89   OPEN MIDDLE LOBECTOMY DUE TO ABSCESS  . WISDOM TOOTH EXTRACTION         Home Medications    Prior to Admission medications   Medication Sig Start Date End Date Taking? Authorizing Provider  aspirin EC 325 MG tablet Take 1 tablet (325 mg total) by mouth daily for 30 days, for blood clot prevention. 01/23/22     levothyroxine (SYNTHROID) 75 MCG tablet Take 1 tablet (75 mcg total) by mouth every morning on an empty stomach. 09/15/21     levothyroxine (SYNTHROID) 75 MCG tablet Take 1 tablet (75 mcg total) by mouth every morning on an empty stomach. 07/05/22     oxyCODONE (OXY IR/ROXICODONE) 5 MG immediate release tablet Take 1 tablet (5 mg total) by mouth every 4 (four) hours as needed for pain. 01/23/22     sertraline (ZOLOFT) 50 MG tablet Take 1 tablet (50 mg total) by mouth every evening. 05/28/22     sertraline (ZOLOFT) 50 MG tablet Take 1 tablet (50 mg total) by mouth daily. 07/05/22       Family History Family History  Problem Relation Age of Onset  . Healthy Mother   . Diabetes Father     Social History Social History   Tobacco Use  . Smoking status: Never  . Smokeless tobacco: Never  Vaping Use  . Vaping status: Never Used  Substance Use Topics  .  Alcohol use: Yes    Alcohol/week: 4.0 standard drinks of alcohol    Types: 4 Standard drinks or equivalent per week    Comment: weekly  . Drug use: No     Allergies   Patient has no known allergies.   Review of Systems Review of Systems   Physical Exam Triage Vital Signs ED Triage Vitals  Encounter Vitals Group     BP 08/15/22 1919 (!) 141/87     Systolic BP Percentile --      Diastolic BP Percentile --      Pulse Rate 08/15/22 1919 63     Resp 08/15/22 1919 17     Temp 08/15/22 1919 98 F (36.7 C)     Temp Source 08/15/22 1919 Oral     SpO2 08/15/22  1919 96 %     Weight --      Height --      Head Circumference --      Peak Flow --      Pain Score 08/15/22 1918 4     Pain Loc --      Pain Education --      Exclude from Growth Chart --    No data found.  Updated Vital Signs BP (!) 141/87 (BP Location: Right Arm)   Pulse 63   Temp 98 F (36.7 C) (Oral)   Resp 17   SpO2 96%   Visual Acuity Right Eye Distance:   Left Eye Distance:   Bilateral Distance:    Right Eye Near:   Left Eye Near:    Bilateral Near:     Physical Exam   UC Treatments / Results  Labs (all labs ordered are listed, but only abnormal results are displayed) Labs Reviewed - No data to display  EKG   Radiology No results found.  Procedures Procedures (including critical care time)  Medications Ordered in UC Medications - No data to display  Initial Impression / Assessment and Plan / UC Course  I have reviewed the triage vital signs and the nursing notes.  Pertinent labs & imaging results that were available during my care of the patient were reviewed by me and considered in my medical decision making (see chart for details).     *** Final Clinical Impressions(s) / UC Diagnoses   Final diagnoses:  None   Discharge Instructions   None    ED Prescriptions   None    PDMP not reviewed this encounter.

## 2022-08-15 NOTE — Discharge Instructions (Signed)
Your symptoms are most likely due to a viral illness, which will improve on its own with rest and fluids.  COVID testing is pending, staff will call you if this is positive. Wear a mask for 5 days of symptoms while you are in public, then you may remove your mask. You may go back to work if you do not have a fever for 24 hours without any medicines.  - Take prescribed medicines to help with symptoms: tessalon perles - Use over the counter medicines to help with symptoms as discussed: Ibuprofen, tylenol, guaifenesin (mucinex), zyrtec, etc - Two teaspoons of honey in warm water every 4-6 hours may help with throat pains - Humidifier in your room at night to help add water the air and soothe cough  If you develop any new or worsening symptoms or do not improve in the next 2 to 3 days, please return.  If your symptoms are severe, please go to the emergency room.  Follow-up with PCP as needed.

## 2022-08-16 ENCOUNTER — Other Ambulatory Visit (HOSPITAL_BASED_OUTPATIENT_CLINIC_OR_DEPARTMENT_OTHER): Payer: Self-pay

## 2022-09-07 DIAGNOSIS — G4733 Obstructive sleep apnea (adult) (pediatric): Secondary | ICD-10-CM | POA: Diagnosis not present

## 2022-09-10 ENCOUNTER — Other Ambulatory Visit (HOSPITAL_BASED_OUTPATIENT_CLINIC_OR_DEPARTMENT_OTHER): Payer: Self-pay

## 2022-09-11 ENCOUNTER — Other Ambulatory Visit: Payer: Self-pay

## 2022-09-11 ENCOUNTER — Other Ambulatory Visit (HOSPITAL_BASED_OUTPATIENT_CLINIC_OR_DEPARTMENT_OTHER): Payer: Self-pay

## 2022-09-12 ENCOUNTER — Other Ambulatory Visit (HOSPITAL_BASED_OUTPATIENT_CLINIC_OR_DEPARTMENT_OTHER): Payer: Self-pay

## 2022-09-12 MED ORDER — SERTRALINE HCL 50 MG PO TABS
50.0000 mg | ORAL_TABLET | Freq: Every day | ORAL | 0 refills | Status: DC
Start: 2022-09-12 — End: 2023-01-16
  Filled 2022-09-12: qty 30, 30d supply, fill #0
  Filled 2022-10-21: qty 90, 90d supply, fill #0

## 2022-09-14 ENCOUNTER — Other Ambulatory Visit (HOSPITAL_BASED_OUTPATIENT_CLINIC_OR_DEPARTMENT_OTHER): Payer: Self-pay

## 2022-10-08 DIAGNOSIS — G4733 Obstructive sleep apnea (adult) (pediatric): Secondary | ICD-10-CM | POA: Diagnosis not present

## 2022-10-12 ENCOUNTER — Other Ambulatory Visit (HOSPITAL_BASED_OUTPATIENT_CLINIC_OR_DEPARTMENT_OTHER): Payer: Self-pay

## 2022-10-22 ENCOUNTER — Other Ambulatory Visit (HOSPITAL_BASED_OUTPATIENT_CLINIC_OR_DEPARTMENT_OTHER): Payer: Self-pay

## 2022-11-07 DIAGNOSIS — G4733 Obstructive sleep apnea (adult) (pediatric): Secondary | ICD-10-CM | POA: Diagnosis not present

## 2022-11-17 ENCOUNTER — Ambulatory Visit
Admission: EM | Admit: 2022-11-17 | Discharge: 2022-11-17 | Disposition: A | Discharge status: Home / Self Care | Payer: BC Managed Care – PPO | Attending: Internal Medicine | Admitting: Internal Medicine

## 2022-11-17 DIAGNOSIS — R519 Headache, unspecified: Secondary | ICD-10-CM | POA: Diagnosis not present

## 2022-11-17 DIAGNOSIS — S0181XA Laceration without foreign body of other part of head, initial encounter: Secondary | ICD-10-CM

## 2022-11-17 NOTE — ED Triage Notes (Signed)
Pt reports he has a laceration under left eye, after a tree branch fell over his face around 09:30 am today. Pt reports he sees a faint black line in his left eye when he moves his eye.

## 2022-11-17 NOTE — Discharge Instructions (Signed)
WOUND CARE ?Please return in 6 days to have your stitches/staples removed or sooner if you have concerns. ? Keep area clean and dry for 24 hours. Do not remove bandage, if applied. ? After 24 hours, remove bandage and wash wound gently with mild soap and warm water. Reapply a new bandage after cleaning wound, if directed. ? Continue daily cleansing with soap and water until stitches/staples are removed. ? Do not apply any ointments or creams to the wound while stitches/staples are in place, as this may cause delayed healing. ? Notify the office if you experience any of the following signs of infection: Swelling, redness, pus drainage, streaking, fever >101.0 F ? Notify the office if you experience excessive bleeding that does not stop after 15-20 minutes of constant, firm pressure. ? ?

## 2022-11-17 NOTE — ED Provider Notes (Signed)
[facility]  Note:  This document was prepared using Conservation officer, historic buildings and may include unintentional dictation errors.  MRN: [id number] DOB: [birth year]  Subjective:   Louis Long is a 47 y.o. male presenting for suffering a left facial laceration over the upper aspect of his left cheek.  This was from trying to clear tree debris, unfortunately a tree branch slapped back against his face.  He initially had slight visual disturbance of the left eye.  But has since resolved and has not felt any more vision changes.  No bleeding of the eye itself, redness, eyelid pain or swelling.  No loss of consciousness, confusion.  No current facility-administered medications for this encounter.  Current Outpatient Medications:    citalopram (CELEXA) 20 MG tablet, , Disp: , Rfl:    aspirin EC 325 MG tablet, Take 1 tablet (325 mg total) by mouth daily for 30 days, for blood clot prevention., Disp: 100 tablet, Rfl: 0   benzonatate (TESSALON) 100 MG capsule, Take 1 capsule (100 mg total) by mouth every 8 (eight) hours., Disp: 21 capsule, Rfl: 0   levothyroxine (SYNTHROID) 75 MCG tablet, Take 1 tablet (75 mcg total) by mouth every morning on an empty stomach., Disp: 90 tablet, Rfl: 3   levothyroxine (SYNTHROID) 75 MCG tablet, Take 1 tablet (75 mcg total) by mouth every morning on an empty stomach., Disp: 90 tablet, Rfl: 4   oxyCODONE (OXY IR/ROXICODONE) 5 MG immediate release tablet, Take 1 tablet (5 mg total) by mouth every 4 (four) hours as needed for pain., Disp: 30 tablet, Rfl: 0   sertraline (ZOLOFT) 50 MG tablet, Take 1 tablet (50 mg total) by mouth daily., Disp: 90 tablet, Rfl: 4   sertraline (ZOLOFT) 50 MG tablet, Take 1 tablet (50 mg total) by mouth daily., Disp: 90 tablet, Rfl: 0   No Known Allergies  Past Medical History:  Diagnosis Date   History of kidney stones    Left ureteral calculus    S/P lobectomy of lung    Thyroid disease      Past Surgical  History:  Procedure Laterality Date   CYSTOSCOPY WITH RETROGRADE PYELOGRAM, URETEROSCOPY AND STENT PLACEMENT Left 08/22/2012   Procedure: CYSTOSCOPY WITH RETROGRADE PYELOGRAM, LEFT URETEROSCOPY LASER AND LEFT DOUBLE J STENT PLACEMENT;  Surgeon: Kathi Ludwig, MD;  Location: [facility];  Service: Urology;  Laterality: Left;   EXTRACORPOREAL SHOCK WAVE LITHOTRIPSY Left 06-26-2012   HOLMIUM LASER APPLICATION Left 08/22/2012   Procedure: HOLMIUM LASER APPLICATION;  Surgeon: Kathi Ludwig, MD;  Location: [facility];  Service: Urology;  Laterality: Left;   LUNG LOBECTOMY Right AGE 55   OPEN MIDDLE LOBECTOMY DUE TO ABSCESS   WISDOM TOOTH EXTRACTION      Family History  Problem Relation Age of Onset   Healthy Mother    Diabetes Father     Social History   Tobacco Use   Smoking status: Never   Smokeless tobacco: Never  Vaping Use   Vaping status: Never Used  Substance Use Topics   Alcohol use: Yes    Alcohol/week: 4.0 standard drinks of alcohol    Types: 4 Standard drinks or equivalent per week    Comment: weekly   Drug use: No    ROS   Objective:   Vitals: BP 137/85 (BP Location: Left Arm)   Pulse 68   Temp 98.1 F (36.7 C) (Oral)   Resp 18   SpO2 95%   Physical  Exam Constitutional:      General: He is not in acute distress.    Appearance: Normal appearance. He is well-developed and normal weight. He is not ill-appearing, toxic-appearing or diaphoretic.  HENT:     Head: Normocephalic and atraumatic.      Right Ear: External ear normal.     Left Ear: External ear normal.     Nose: Nose normal.     Mouth/Throat:     Pharynx: Oropharynx is clear.  Eyes:     General: Lids are normal. Lids are everted, no foreign bodies appreciated. Vision grossly intact. No visual field deficit or scleral icterus.       Right eye: No foreign body, discharge or hordeolum.        Left eye: No foreign body, discharge or hordeolum.      Extraocular Movements: Extraocular movements intact.     Right eye: Normal extraocular motion and no nystagmus.     Left eye: Normal extraocular motion and no nystagmus.     Conjunctiva/sclera:     Right eye: Right conjunctiva is not injected. No chemosis, exudate or hemorrhage.    Left eye: Left conjunctiva is not injected. No chemosis, exudate or hemorrhage. Cardiovascular:     Rate and Rhythm: Normal rate.  Pulmonary:     Effort: Pulmonary effort is normal.  Musculoskeletal:     Cervical back: Normal range of motion.  Neurological:     Mental Status: He is alert and oriented to person, place, and time.  Psychiatric:        Mood and Affect: Mood normal.        Behavior: Behavior normal.        Thought Content: Thought content normal.        Judgment: Judgment normal.    Eye Exam: Eyelids everted and swept for foreign body. The eye was stained with fluorescein. Examination under woods lamp does not reveal a foreign body or area of increased stain uptake. The eye was then irrigated copiously with saline.  PROCEDURE NOTE: laceration repair Verbal consent obtained from patient.  Local anesthesia with 2cc Lidocaine 2% with epinephrine.  Wound explored for tendon, ligament damage. Wound scrubbed with soap and water and rinsed. Wound closed with #2 5-0 Prolene (simple interrupted) sutures.  Wound cleansed and dressed.   Assessment and Plan :   PDMP not reviewed this encounter.  1. Facial pain   2. Facial laceration, initial encounter    No obvious direct eye trauma.  Recommended close follow-up with an ophthalmologist.  Laceration repaired successfully. Wound care reviewed. Recommended Tylenol and/or ibuprofen for pain control. Return-to-clinic precautions discussed, patient verbalized understanding. Otherwise, follow up in 6 days for suture removal. Counseled patient on potential for adverse effects with medications prescribed/recommended today, ER and return-to-clinic precautions  discussed, patient verbalized understanding.    Wallis Bamberg, PA-C 11/17/22 1112

## 2022-12-19 DIAGNOSIS — R195 Other fecal abnormalities: Secondary | ICD-10-CM | POA: Diagnosis not present

## 2022-12-19 DIAGNOSIS — K625 Hemorrhage of anus and rectum: Secondary | ICD-10-CM | POA: Diagnosis not present

## 2022-12-19 DIAGNOSIS — R1013 Epigastric pain: Secondary | ICD-10-CM | POA: Diagnosis not present

## 2023-01-11 ENCOUNTER — Other Ambulatory Visit (HOSPITAL_BASED_OUTPATIENT_CLINIC_OR_DEPARTMENT_OTHER): Payer: Self-pay

## 2023-01-11 DIAGNOSIS — R1013 Epigastric pain: Secondary | ICD-10-CM | POA: Diagnosis not present

## 2023-01-11 DIAGNOSIS — R195 Other fecal abnormalities: Secondary | ICD-10-CM | POA: Diagnosis not present

## 2023-01-11 MED ORDER — SUTAB 1479-225-188 MG PO TABS
ORAL_TABLET | ORAL | 0 refills | Status: AC
  Filled 2023-01-11: qty 24, 1d supply, fill #0

## 2023-01-14 ENCOUNTER — Other Ambulatory Visit (HOSPITAL_BASED_OUTPATIENT_CLINIC_OR_DEPARTMENT_OTHER): Payer: Self-pay

## 2023-01-15 DIAGNOSIS — K5289 Other specified noninfective gastroenteritis and colitis: Secondary | ICD-10-CM | POA: Diagnosis not present

## 2023-01-15 DIAGNOSIS — D124 Benign neoplasm of descending colon: Secondary | ICD-10-CM | POA: Diagnosis not present

## 2023-01-15 DIAGNOSIS — K621 Rectal polyp: Secondary | ICD-10-CM | POA: Diagnosis not present

## 2023-01-15 DIAGNOSIS — Z1211 Encounter for screening for malignant neoplasm of colon: Secondary | ICD-10-CM | POA: Diagnosis not present

## 2023-01-15 DIAGNOSIS — D122 Benign neoplasm of ascending colon: Secondary | ICD-10-CM | POA: Diagnosis not present

## 2023-01-15 DIAGNOSIS — K5730 Diverticulosis of large intestine without perforation or abscess without bleeding: Secondary | ICD-10-CM | POA: Diagnosis not present

## 2023-01-16 ENCOUNTER — Other Ambulatory Visit (HOSPITAL_BASED_OUTPATIENT_CLINIC_OR_DEPARTMENT_OTHER): Payer: Self-pay

## 2023-01-16 MED ORDER — SERTRALINE HCL 50 MG PO TABS: 50.0000 mg | ORAL_TABLET | Freq: Every day | ORAL | 1 refills | Status: AC

## 2023-02-04 DIAGNOSIS — L814 Other melanin hyperpigmentation: Secondary | ICD-10-CM | POA: Diagnosis not present

## 2023-02-04 DIAGNOSIS — D225 Melanocytic nevi of trunk: Secondary | ICD-10-CM | POA: Diagnosis not present

## 2023-02-04 DIAGNOSIS — L538 Other specified erythematous conditions: Secondary | ICD-10-CM | POA: Diagnosis not present

## 2023-02-04 DIAGNOSIS — L821 Other seborrheic keratosis: Secondary | ICD-10-CM | POA: Diagnosis not present

## 2023-02-04 DIAGNOSIS — Z789 Other specified health status: Secondary | ICD-10-CM | POA: Diagnosis not present

## 2023-02-04 DIAGNOSIS — L281 Prurigo nodularis: Secondary | ICD-10-CM | POA: Diagnosis not present

## 2023-02-04 DIAGNOSIS — L918 Other hypertrophic disorders of the skin: Secondary | ICD-10-CM | POA: Diagnosis not present

## 2023-07-15 DIAGNOSIS — Z23 Encounter for immunization: Secondary | ICD-10-CM | POA: Diagnosis not present

## 2023-07-15 DIAGNOSIS — R7303 Prediabetes: Secondary | ICD-10-CM | POA: Diagnosis not present

## 2023-07-15 DIAGNOSIS — E782 Mixed hyperlipidemia: Secondary | ICD-10-CM | POA: Diagnosis not present

## 2023-07-15 DIAGNOSIS — Z0000 Encounter for general adult medical examination without abnormal findings: Secondary | ICD-10-CM | POA: Diagnosis not present

## 2023-07-15 DIAGNOSIS — E039 Hypothyroidism, unspecified: Secondary | ICD-10-CM | POA: Diagnosis not present

## 2023-07-15 DIAGNOSIS — F331 Major depressive disorder, recurrent, moderate: Secondary | ICD-10-CM | POA: Diagnosis not present

## 2023-09-06 IMAGING — MR MR BRAIN/IAC WO/W CM
12 of 13 series · 44 of 48 positions shown · IV contrast (20 ml multihance)
Comparison: No pertinent prior exams available for comparison.

CLINICAL DATA: Provided history: Asymmetrical sensorineural hearing
loss. Additional history provided by scanning technologist: Patient
reports left-sided tinnitus.

EXAM:
MRI HEAD WITHOUT AND WITH CONTRAST
TECHNIQUE: Multiplanar, multiecho pulse sequences of the brain and surrounding
structures were obtained without and with intravenous contrast.
CONTRAST:  20mL MULTIHANCE GADOBENATE DIMEGLUMINE 529 MG/ML IV SOLN

[Series 5: T1 · sagittal · 4.0mm · 0.72mm/px · 1 of 27 slices shown (1 of 3)]
[im 1/27]
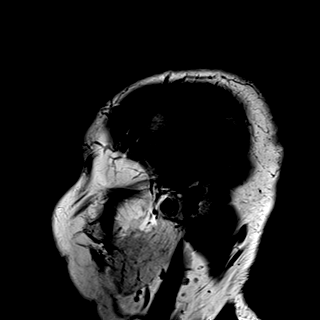

[Series 6: DWI · axial · 3.0mm · 0.94mm/px · z∈[-57,+103]mm · 10 of 184 slices shown]
[im 1/184]
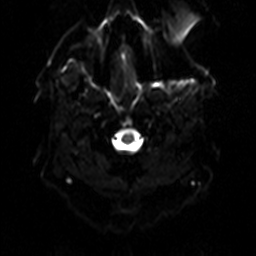
[im 21/184]
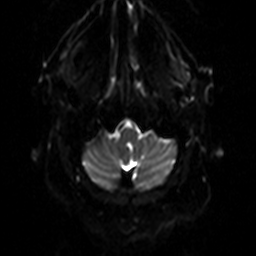
[im 41/184]
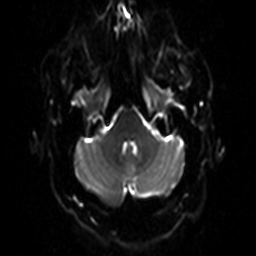
[im 62/184]
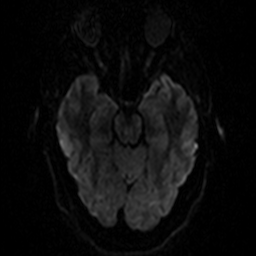
[im 82/184]
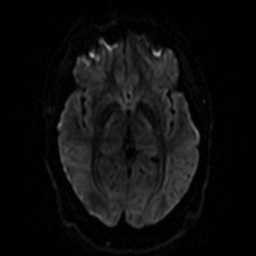
[im 102/184]
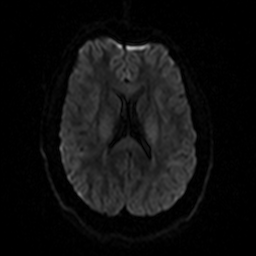
[im 123/184]
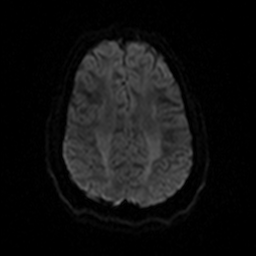
[im 143/184]
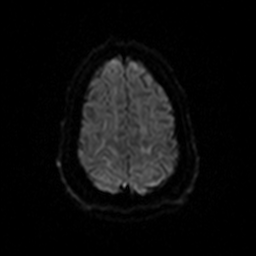
[im 163/184]
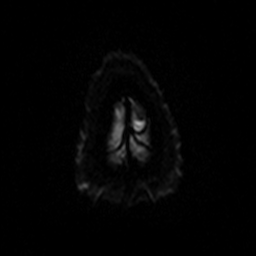
[im 184/184]
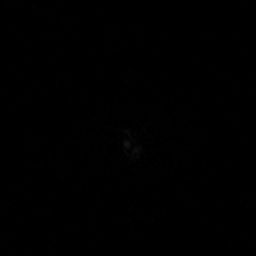

[Series 7: ax dwi_tracew · axial · 3.0mm · 0.94mm/px · z∈[-57,+103]mm · 5 of 92 slices shown]
[im 1/92]
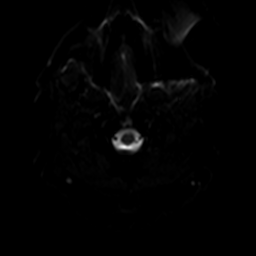
[im 23/92]
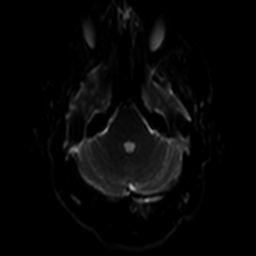
[im 46/92]
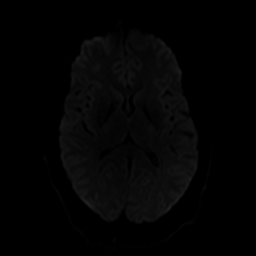
[im 69/92]
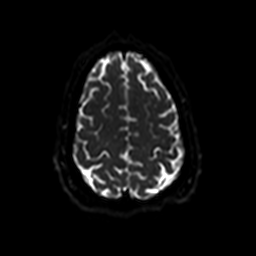
[im 92/92]
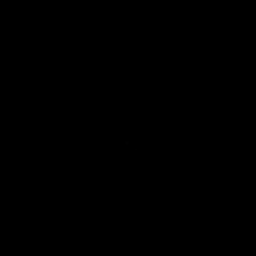

[Series 8: ax dwi_adc · axial · 3.0mm · 0.94mm/px · z∈[-57,+103]mm · 3 of 46 slices shown]
[im 1/46]
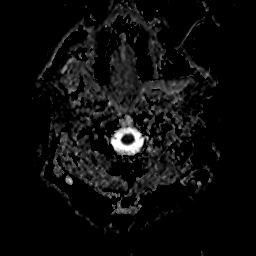
[im 23/46]
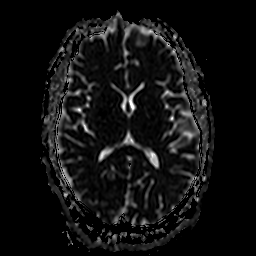
[im 46/46]
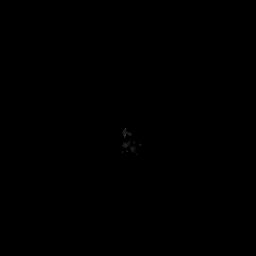

[Series 9: T2 · axial · 3.0mm · 0.36mm/px · z∈[-61,+99]mm · 3 of 46 slices shown]
[im 1/46]
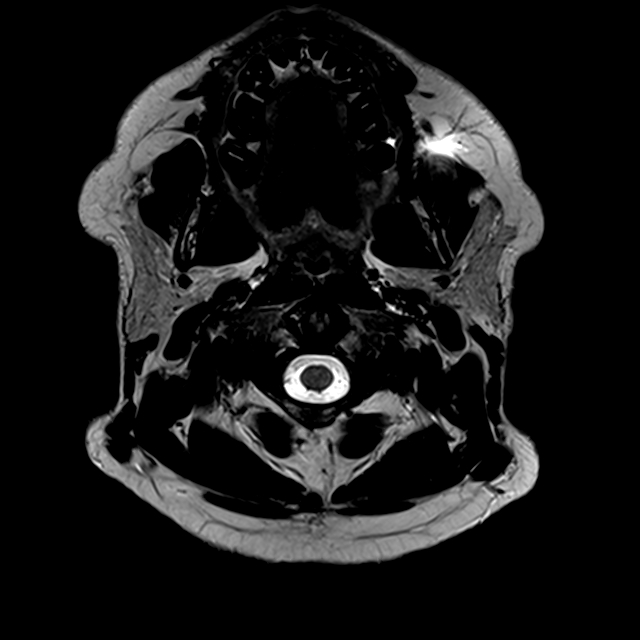
[im 23/46]
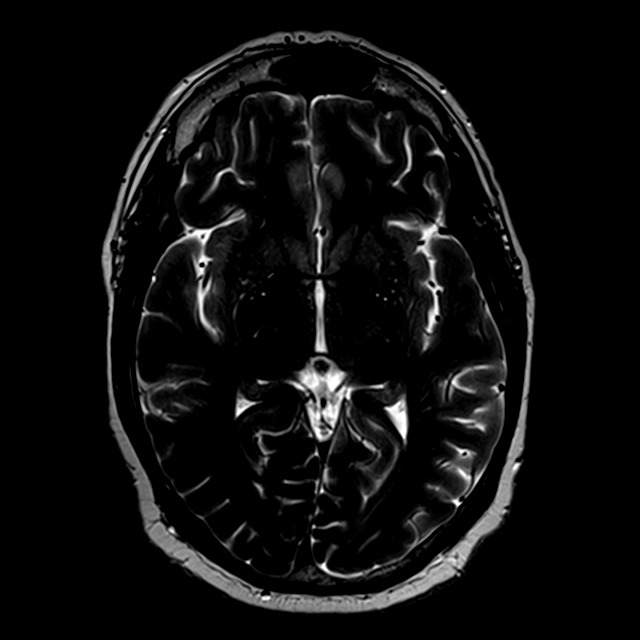
[im 46/46]
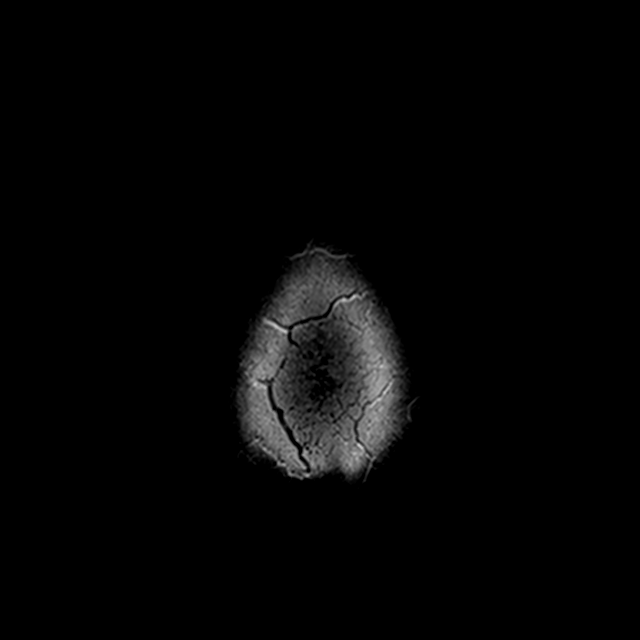

[Series 10: FLAIR · axial · 3.0mm · 0.72mm/px · z∈[-61,+99]mm · 2 of 28 slices shown]
[im 1/28]
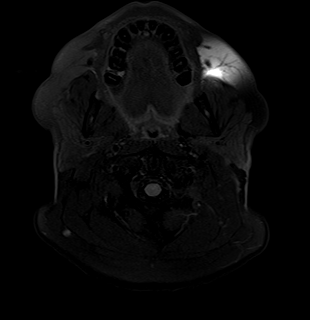
[im 28/28]
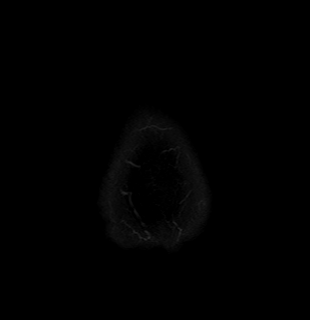

[Series 11: swi_images · axial · 1.5mm · 0.90mm/px · z∈[-66,+99]mm · 7 of 112 slices shown]
[im 1/112]
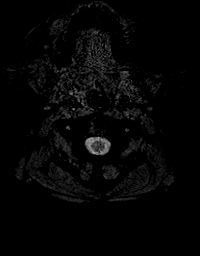
[im 19/112]
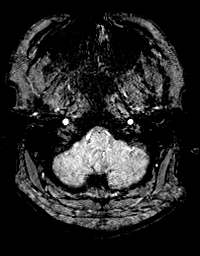
[im 38/112]
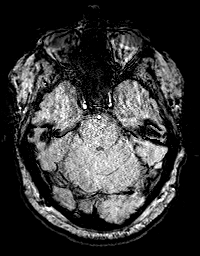
[im 56/112]
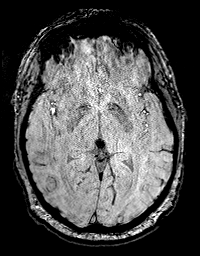
[im 75/112]
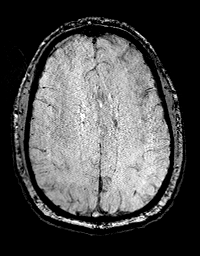
[im 93/112]
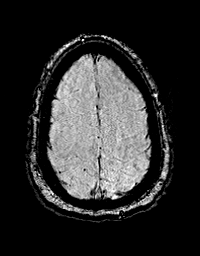
[im 112/112]
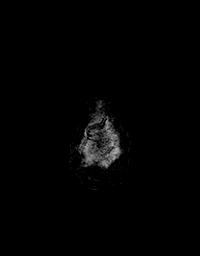

[Series 13: T1 · coronal · 3.0mm · 0.56mm/px · 1 of 15 slices shown (2 of 3)]
[im 1/15]
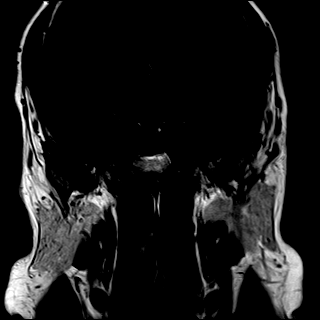

[Series 14: T1 · axial · 3.0mm · 0.50mm/px · 1 of 15 slices shown (3 of 3)]
[im 1/15]
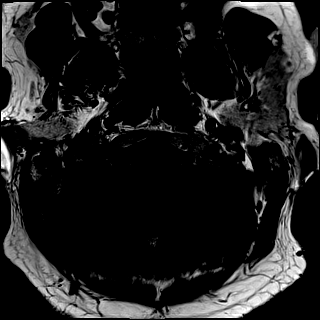

[Series 17: T1 post-contrast · coronal · 3.0mm · 0.70mm/px · 1 of 15 slices shown (1 of 3)]
[im 1/15]
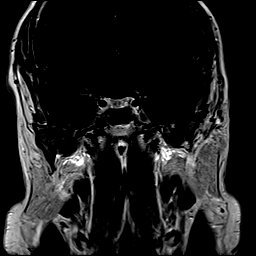

[Series 18: T1 post-contrast · axial · 3.0mm · 0.50mm/px · 1 of 15 slices shown (2 of 3)]
[im 1/15]
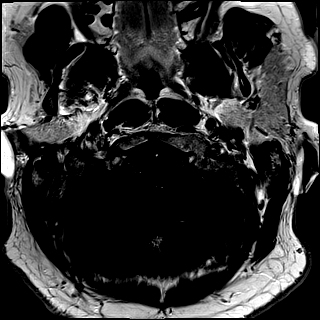

[Series 19: T1 post-contrast · axial · 1.0mm · 0.90mm/px · z∈[-53,+104]mm · 9 of 160 slices shown (3 of 3)]
[im 1/160]
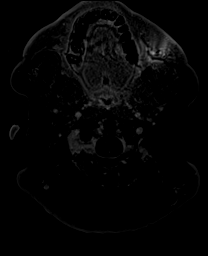
[im 20/160]
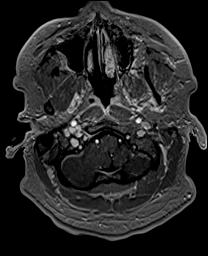
[im 40/160]
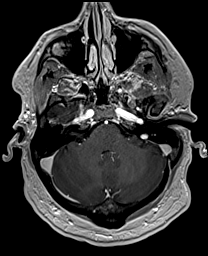
[im 60/160]
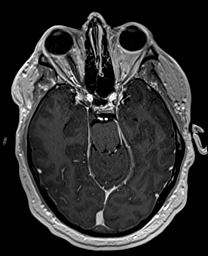
[im 80/160]
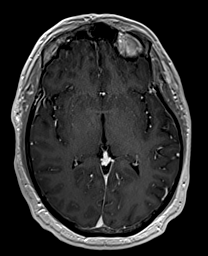
[im 100/160]
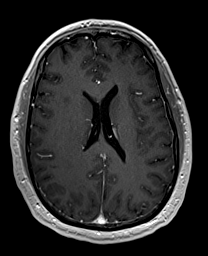
[im 120/160]
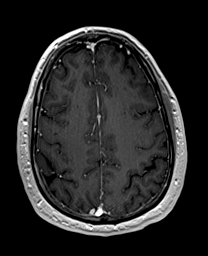
[im 140/160]
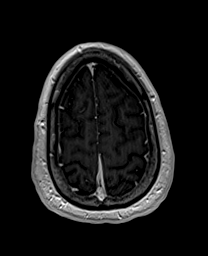
[im 160/160]
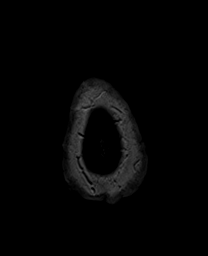

[44 of 48 positions shown; findings below may reference images not displayed]

FINDINGS: Brain:

Mild intermittent motion degradation.

Cerebral volume is normal for age.

Mild multifocal T2/FLAIR hyperintensity within the cerebral white
matter, nonspecific but most often secondary to chronic small vessel
ischemia.

No evidence of an intracranial mass. Specifically, no
cerebellopontine angle or internal auditory canal mass is
demonstrated. Unremarkable appearance of the seventh and eighth
cranial nerves bilaterally.

There is no acute infarct.

No chronic intracranial blood products.

No extra-axial fluid collection.

No midline shift.

No pathologic intracranial enhancement.

Vascular: Maintained flow voids within the proximal large arterial
vessels. Small right frontal lobe developmental venous anomaly (an
incidental anatomic variant). The left jugular bulb is high in
position as compared to the right.

Skull and upper cervical spine: No focal suspicious marrow lesion.

Sinuses/Orbits: Visualized orbits show no acute finding. Small
mucous retention cyst within the left sphenoid sinus.

Other: Trace fluid within the left mastoid air cells.
IMPRESSION: No evidence of acute intracranial abnormality.

No cerebellopontine angle or internal auditory canal mass.

The left jugular bulb is high in position as compared to the right.

Mild multifocal T2 FLAIR hyperintense signal abnormality within the
cerebral white matter, nonspecific but most often secondary to
chronic small vessel ischemia.

Small left sphenoid sinus mucous retention cyst.

Trace fluid within the left mastoid air cells.

## 2023-10-08 DIAGNOSIS — E039 Hypothyroidism, unspecified: Secondary | ICD-10-CM | POA: Diagnosis not present

## 2023-11-08 DIAGNOSIS — R7303 Prediabetes: Secondary | ICD-10-CM | POA: Diagnosis not present

## 2023-11-08 DIAGNOSIS — E039 Hypothyroidism, unspecified: Secondary | ICD-10-CM | POA: Diagnosis not present
